# Patient Record
Sex: Male | Born: 1970 | Race: White | Hispanic: No | Marital: Married | Smoking: Former smoker
Health system: Southern US, Community
[De-identification: ages and names within clinical notes are randomized; demographics above are authoritative.]

## PROBLEM LIST (undated history)

## (undated) DIAGNOSIS — M5090 Cervical disc disorder, unspecified, unspecified cervical region: Secondary | ICD-10-CM

## (undated) DIAGNOSIS — N179 Acute kidney failure, unspecified: Secondary | ICD-10-CM

## (undated) DIAGNOSIS — R748 Abnormal levels of other serum enzymes: Secondary | ICD-10-CM

## (undated) DIAGNOSIS — J80 Acute respiratory distress syndrome: Secondary | ICD-10-CM

## (undated) DIAGNOSIS — J690 Pneumonitis due to inhalation of food and vomit: Secondary | ICD-10-CM

## (undated) DIAGNOSIS — F1910 Other psychoactive substance abuse, uncomplicated: Secondary | ICD-10-CM

---

## 2004-10-04 ENCOUNTER — Emergency Department (HOSPITAL_COMMUNITY): Admission: EM | Admit: 2004-10-04 | Discharge: 2004-10-04 | Payer: Self-pay | Admitting: Emergency Medicine

## 2006-02-18 ENCOUNTER — Encounter: Admission: RE | Admit: 2006-02-18 | Discharge: 2006-02-18 | Payer: Self-pay | Admitting: Neurosurgery

## 2006-05-19 ENCOUNTER — Ambulatory Visit (HOSPITAL_COMMUNITY): Admission: RE | Admit: 2006-05-19 | Discharge: 2006-05-19 | Payer: Self-pay | Admitting: Neurology

## 2007-04-20 ENCOUNTER — Encounter: Admission: RE | Admit: 2007-04-20 | Discharge: 2007-04-20 | Payer: Self-pay | Admitting: Neurology

## 2007-07-13 ENCOUNTER — Ambulatory Visit (HOSPITAL_COMMUNITY): Admission: RE | Admit: 2007-07-13 | Discharge: 2007-07-13 | Payer: Self-pay | Admitting: Neurology

## 2009-12-31 ENCOUNTER — Emergency Department (HOSPITAL_COMMUNITY): Admission: EM | Admit: 2009-12-31 | Discharge: 2009-12-31 | Payer: Self-pay | Admitting: Emergency Medicine

## 2010-05-27 ENCOUNTER — Encounter: Payer: Self-pay | Admitting: Neurosurgery

## 2011-09-06 ENCOUNTER — Emergency Department (HOSPITAL_COMMUNITY): Payer: BC Managed Care – PPO

## 2011-09-06 ENCOUNTER — Encounter (HOSPITAL_COMMUNITY): Payer: Self-pay | Admitting: Emergency Medicine

## 2011-09-06 ENCOUNTER — Inpatient Hospital Stay (HOSPITAL_COMMUNITY)
Admission: AD | Admit: 2011-09-06 | Discharge: 2011-09-19 | DRG: 541 | Disposition: A | Discharge status: Home / Self Care | Payer: BC Managed Care – PPO | Attending: Internal Medicine | Admitting: Internal Medicine

## 2011-09-06 ENCOUNTER — Inpatient Hospital Stay (HOSPITAL_COMMUNITY): Payer: BC Managed Care – PPO

## 2011-09-06 ENCOUNTER — Other Ambulatory Visit: Payer: Self-pay

## 2011-09-06 DIAGNOSIS — G8929 Other chronic pain: Secondary | ICD-10-CM | POA: Diagnosis present

## 2011-09-06 DIAGNOSIS — R7309 Other abnormal glucose: Secondary | ICD-10-CM | POA: Diagnosis present

## 2011-09-06 DIAGNOSIS — J9690 Respiratory failure, unspecified, unspecified whether with hypoxia or hypercapnia: Secondary | ICD-10-CM

## 2011-09-06 DIAGNOSIS — J80 Acute respiratory distress syndrome: Secondary | ICD-10-CM | POA: Diagnosis present

## 2011-09-06 DIAGNOSIS — D649 Anemia, unspecified: Secondary | ICD-10-CM | POA: Diagnosis present

## 2011-09-06 DIAGNOSIS — R7989 Other specified abnormal findings of blood chemistry: Secondary | ICD-10-CM | POA: Diagnosis present

## 2011-09-06 DIAGNOSIS — I200 Unstable angina: Secondary | ICD-10-CM | POA: Diagnosis present

## 2011-09-06 DIAGNOSIS — J9600 Acute respiratory failure, unspecified whether with hypoxia or hypercapnia: Principal | ICD-10-CM | POA: Diagnosis present

## 2011-09-06 DIAGNOSIS — I959 Hypotension, unspecified: Secondary | ICD-10-CM | POA: Diagnosis present

## 2011-09-06 DIAGNOSIS — A419 Sepsis, unspecified organism: Secondary | ICD-10-CM | POA: Diagnosis present

## 2011-09-06 DIAGNOSIS — M5020 Other cervical disc displacement, unspecified cervical region: Secondary | ICD-10-CM | POA: Diagnosis present

## 2011-09-06 DIAGNOSIS — M5090 Cervical disc disorder, unspecified, unspecified cervical region: Secondary | ICD-10-CM | POA: Diagnosis present

## 2011-09-06 DIAGNOSIS — N179 Acute kidney failure, unspecified: Secondary | ICD-10-CM | POA: Diagnosis present

## 2011-09-06 DIAGNOSIS — M542 Cervicalgia: Secondary | ICD-10-CM | POA: Diagnosis present

## 2011-09-06 DIAGNOSIS — M25519 Pain in unspecified shoulder: Secondary | ICD-10-CM | POA: Diagnosis present

## 2011-09-06 DIAGNOSIS — J189 Pneumonia, unspecified organism: Secondary | ICD-10-CM

## 2011-09-06 DIAGNOSIS — R748 Abnormal levels of other serum enzymes: Secondary | ICD-10-CM | POA: Diagnosis present

## 2011-09-06 DIAGNOSIS — R1310 Dysphagia, unspecified: Secondary | ICD-10-CM | POA: Diagnosis present

## 2011-09-06 DIAGNOSIS — F1910 Other psychoactive substance abuse, uncomplicated: Secondary | ICD-10-CM | POA: Diagnosis present

## 2011-09-06 DIAGNOSIS — J690 Pneumonitis due to inhalation of food and vomit: Secondary | ICD-10-CM | POA: Diagnosis present

## 2011-09-06 DIAGNOSIS — G9340 Encephalopathy, unspecified: Secondary | ICD-10-CM | POA: Diagnosis present

## 2011-09-06 HISTORY — DX: Acute respiratory distress syndrome: J80

## 2011-09-06 HISTORY — DX: Abnormal levels of other serum enzymes: R74.8

## 2011-09-06 HISTORY — DX: Pneumonitis due to inhalation of food and vomit: J69.0

## 2011-09-06 HISTORY — DX: Other psychoactive substance abuse, uncomplicated: F19.10

## 2011-09-06 HISTORY — DX: Acute kidney failure, unspecified: N17.9

## 2011-09-06 HISTORY — DX: Cervical disc disorder, unspecified, unspecified cervical region: M50.90

## 2011-09-06 LAB — COMPREHENSIVE METABOLIC PANEL
Alkaline Phosphatase: 177 U/L — ABNORMAL HIGH (ref 39–117)
BUN: 5 mg/dL — ABNORMAL LOW (ref 6–23)
BUN: 6 mg/dL (ref 6–23)
CO2: 29 mEq/L (ref 19–32)
CO2: 25 mEq/L (ref 19–32)
Calcium: 7.9 mg/dL — ABNORMAL LOW (ref 8.4–10.5)
Chloride: 99 mEq/L (ref 96–112)
Creatinine, Ser: 0.65 mg/dL (ref 0.50–1.35)
GFR calc Af Amer: >90 mL/min (ref >90)
GFR calc Af Amer: >90 mL/min (ref >90)
GFR calc non Af Amer: >90 mL/min (ref >90)
GFR calc non Af Amer: >90 mL/min (ref >90)
Glucose, Bld: 137 mg/dL — ABNORMAL HIGH (ref 70–99)
Glucose, Bld: 116 mg/dL — ABNORMAL HIGH (ref 70–99)
Potassium: 3 mEq/L — ABNORMAL LOW (ref 3.5–5.1)
Total Bilirubin: 0.2 mg/dL — ABNORMAL LOW (ref 0.3–1.2)
Total Bilirubin: 0.2 mg/dL — ABNORMAL LOW (ref 0.3–1.2)
Total Protein: 6.8 g/dL (ref 6.0–8.3)

## 2011-09-06 LAB — LACTIC ACID, PLASMA: Lactic Acid, Venous: 7 mmol/L — ABNORMAL HIGH (ref 0.5–2.2)

## 2011-09-06 LAB — URINALYSIS, ROUTINE W REFLEX MICROSCOPIC
Bilirubin Urine: NEGATIVE
Glucose, UA: NEGATIVE mg/dL
Ketones, ur: NEGATIVE mg/dL
Ketones, ur: NEGATIVE mg/dL
Nitrite: NEGATIVE
Nitrite: NEGATIVE
Protein, ur: NEGATIVE mg/dL
Protein, ur: NEGATIVE mg/dL
Urobilinogen, UA: 0.2 mg/dL (ref 0.0–1.0)
Urobilinogen, UA: 0.2 mg/dL (ref 0.0–1.0)

## 2011-09-06 LAB — CBC
HCT: 37.4 % — ABNORMAL LOW (ref 39.0–52.0)
Hemoglobin: 14 g/dL (ref 13.0–17.0)
MCH: 28.9 pg (ref 26.0–34.0)
MCV: 88 fL (ref 78.0–100.0)
Platelets: 328 10*3/uL (ref 150–400)
RBC: 4.85 MIL/uL (ref 4.22–5.81)
RBC: 4.25 MIL/uL (ref 4.22–5.81)
WBC: 7.4 10*3/uL (ref 4.0–10.5)

## 2011-09-06 LAB — POCT I-STAT 3, ART BLOOD GAS (G3+)
Acid-base deficit: 2 mmol/L (ref 0.0–2.0)
Bicarbonate: 25.3 mEq/L — ABNORMAL HIGH (ref 20.0–24.0)
Bicarbonate: 26.6 mEq/L — ABNORMAL HIGH (ref 20.0–24.0)
Bicarbonate: 24 mEq/L (ref 20.0–24.0)
O2 Saturation: 97 %
Patient temperature: 98.6
Patient temperature: 98.6
Patient temperature: 98.6
TCO2: 27 mmol/L (ref 0–100)
TCO2: 26 mmol/L (ref 0–100)
pCO2 arterial: 67.3 mmHg (ref 35.0–45.0)
pCO2 arterial: 68.1 mmHg (ref 35.0–45.0)
pCO2 arterial: 58.6 mmHg (ref 35.0–45.0)
pH, Arterial: 7.183 — CL (ref 7.350–7.450)
pH, Arterial: 7.199 — CL (ref 7.350–7.450)
pH, Arterial: 7.256 — ABNORMAL LOW (ref 7.350–7.450)
pH, Arterial: 7.235 — ABNORMAL LOW (ref 7.350–7.450)
pO2, Arterial: 50 mmHg — ABNORMAL LOW (ref 80.0–100.0)
pO2, Arterial: 125 mmHg — ABNORMAL HIGH (ref 80.0–100.0)

## 2011-09-06 LAB — RAPID URINE DRUG SCREEN, HOSP PERFORMED
Benzodiazepines: POSITIVE — AB
Cocaine: NOT DETECTED

## 2011-09-06 LAB — GLUCOSE, CAPILLARY
Glucose-Capillary: 145 mg/dL — ABNORMAL HIGH (ref 70–99)
Glucose-Capillary: 92 mg/dL (ref 70–99)

## 2011-09-06 LAB — BLOOD GAS, ARTERIAL
Bicarbonate: 26.9 mEq/L — ABNORMAL HIGH (ref 20.0–24.0)
Bicarbonate: 24.4 mEq/L — ABNORMAL HIGH (ref 20.0–24.0)
O2 Saturation: 80.4 %
PEEP: 10 cmH2O
PEEP: 20 cmH2O
TCO2: 23.3 mmol/L (ref 0–100)
pCO2 arterial: 66.8 mmHg (ref 35.0–45.0)
pCO2 arterial: 63.2 mmHg (ref 35.0–45.0)
pH, Arterial: 7.211 — ABNORMAL LOW (ref 7.350–7.450)
pO2, Arterial: 50.8 mmHg — ABNORMAL LOW (ref 80.0–100.0)
pO2, Arterial: 79.9 mmHg — ABNORMAL LOW (ref 80.0–100.0)

## 2011-09-06 LAB — ETHANOL: Alcohol, Ethyl (B): <11 mg/dL (ref 0–11)

## 2011-09-06 LAB — DIFFERENTIAL
Eosinophils Absolute: 0 10*3/uL (ref 0.0–0.7)
Lymphocytes Relative: 13 % (ref 12–46)
Lymphs Abs: 2.4 10*3/uL (ref 0.7–4.0)
Monocytes Relative: 9 % (ref 3–12)
Neutrophils Relative %: 78 % — ABNORMAL HIGH (ref 43–77)

## 2011-09-06 LAB — CK: Total CK: 235 U/L — ABNORMAL HIGH (ref 7–232)

## 2011-09-06 LAB — PROCALCITONIN: Procalcitonin: 12.79 ng/mL

## 2011-09-06 LAB — PHOSPHORUS: Phosphorus: 2.6 mg/dL (ref 2.3–4.6)

## 2011-09-06 LAB — LIPASE, BLOOD: Lipase: 19 U/L (ref 11–59)

## 2011-09-06 LAB — PROTIME-INR: INR: 1.1 (ref 0.00–1.49)

## 2011-09-06 LAB — URINE MICROSCOPIC-ADD ON

## 2011-09-06 LAB — POCT I-STAT TROPONIN I: Troponin i, poc: 0 ng/mL (ref 0.00–0.08)

## 2011-09-06 LAB — C-REACTIVE PROTEIN: CRP: 14.52 mg/dL — ABNORMAL HIGH (ref <0.60)

## 2011-09-06 LAB — APTT: aPTT: 28 seconds (ref 24–37)

## 2011-09-06 LAB — MRSA PCR SCREENING: MRSA by PCR: NEGATIVE

## 2011-09-06 LAB — CARBOXYHEMOGLOBIN: O2 Saturation: 81.1 %

## 2011-09-06 MED ORDER — STUDY - INVESTIGATIONAL DRUG SIMPLE RECORD
40.0000 mg | Freq: Once | Status: AC
  Administered 2011-09-06: 40 mg via ORAL
  Filled 2011-09-06: qty 40

## 2011-09-06 MED ORDER — SODIUM CHLORIDE 0.9 % IV BOLUS (SEPSIS)
1000.0000 mL | Freq: Once | INTRAVENOUS | Status: AC
  Administered 2011-09-06: 1000 mL via INTRAVENOUS

## 2011-09-06 MED ORDER — SODIUM CHLORIDE 0.9 % IV SOLN
2.0000 mg/h | INTRAVENOUS | Status: DC
  Administered 2011-09-06 – 2011-09-07 (×2): 2 mg/h via INTRAVENOUS
  Administered 2011-09-08: 3 mg/h via INTRAVENOUS
  Filled 2011-09-06 (×5): qty 10

## 2011-09-06 MED ORDER — MIDAZOLAM BOLUS VIA INFUSION
1.0000 mg | INTRAVENOUS | Status: DC | PRN
  Filled 2011-09-06 (×2): qty 2

## 2011-09-06 MED ORDER — DOBUTAMINE IN D5W 4-5 MG/ML-% IV SOLN
2.5000 ug/kg/min | INTRAVENOUS | Status: DC | PRN
  Filled 2011-09-06: qty 250

## 2011-09-06 MED ORDER — MIDAZOLAM HCL 2 MG/2ML IJ SOLN
4.0000 mg | Freq: Once | INTRAMUSCULAR | Status: AC
  Administered 2011-09-06: 4 mg via INTRAVENOUS

## 2011-09-06 MED ORDER — SODIUM CHLORIDE 0.9 % IV SOLN
INTRAVENOUS | Status: DC
  Filled 2011-09-06: qty 1

## 2011-09-06 MED ORDER — VANCOMYCIN HCL IN DEXTROSE 1-5 GM/200ML-% IV SOLN
1000.0000 mg | Freq: Two times a day (BID) | INTRAVENOUS | Status: DC
  Administered 2011-09-07: 1000 mg via INTRAVENOUS
  Filled 2011-09-06 (×2): qty 200

## 2011-09-06 MED ORDER — FENTANYL BOLUS VIA INFUSION
50.0000 ug | Freq: Four times a day (QID) | INTRAVENOUS | Status: DC | PRN
  Filled 2011-09-06: qty 100

## 2011-09-06 MED ORDER — SUCCINYLCHOLINE CHLORIDE 20 MG/ML IJ SOLN
200.0000 mg | Freq: Once | INTRAMUSCULAR | Status: AC
  Administered 2011-09-06: 200 mg via INTRAVENOUS

## 2011-09-06 MED ORDER — PANTOPRAZOLE SODIUM 40 MG IV SOLR
40.0000 mg | Freq: Every day | INTRAVENOUS | Status: DC
  Administered 2011-09-06 – 2011-09-08 (×3): 40 mg via INTRAVENOUS
  Filled 2011-09-06 (×4): qty 40

## 2011-09-06 MED ORDER — SODIUM CHLORIDE 0.9 % IV SOLN
INTRAVENOUS | Status: DC | PRN
  Administered 2011-09-06 – 2011-09-08 (×2): 10 mL/h via INTRAVENOUS
  Administered 2011-09-09: 20 mL/h via INTRAVENOUS
  Administered 2011-09-11 – 2011-09-12 (×2): 250 mL via INTRAVENOUS
  Administered 2011-09-14: 21:00:00 via INTRAVENOUS

## 2011-09-06 MED ORDER — FENTANYL CITRATE 0.05 MG/ML IJ SOLN: 100.0000 ug | Freq: Once | INTRAMUSCULAR | Status: AC

## 2011-09-06 MED ORDER — FENTANYL CITRATE 0.05 MG/ML IJ SOLN
100.0000 ug | Freq: Once | INTRAMUSCULAR | Status: AC
  Administered 2011-09-06: 100 ug via INTRAVENOUS

## 2011-09-06 MED ORDER — ETOMIDATE 2 MG/ML IV SOLN
20.0000 mg | Freq: Once | INTRAVENOUS | Status: AC
  Administered 2011-09-06: 20 mg via INTRAVENOUS

## 2011-09-06 MED ORDER — SODIUM CHLORIDE 0.9 % IV SOLN
50.0000 ug/h | INTRAVENOUS | Status: DC
  Administered 2011-09-06: 100 ug/h via INTRAVENOUS
  Administered 2011-09-07 – 2011-09-08 (×2): 200 ug/h via INTRAVENOUS
  Filled 2011-09-06 (×5): qty 50

## 2011-09-06 MED ORDER — SODIUM CHLORIDE 0.9 % IV SOLN
3.0000 g | Freq: Once | INTRAVENOUS | Status: AC
  Administered 2011-09-06: 3 g via INTRAVENOUS
  Filled 2011-09-06: qty 3

## 2011-09-06 MED ORDER — VANCOMYCIN HCL 1000 MG IV SOLR
2000.0000 mg | Freq: Once | INTRAVENOUS | Status: AC
  Administered 2011-09-06: 2000 mg via INTRAVENOUS
  Filled 2011-09-06: qty 2000

## 2011-09-06 MED ORDER — MIDAZOLAM HCL 5 MG/5ML IJ SOLN
INTRAMUSCULAR | Status: AC
  Administered 2011-09-06: 4 mg via INTRAVENOUS
  Filled 2011-09-06: qty 5

## 2011-09-06 MED ORDER — MIDAZOLAM HCL 5 MG/5ML IJ SOLN
4.0000 mg | Freq: Once | INTRAMUSCULAR | Status: AC
  Administered 2011-09-06: 4 mg via INTRAVENOUS

## 2011-09-06 MED ORDER — FENTANYL CITRATE 0.05 MG/ML IJ SOLN
INTRAMUSCULAR | Status: AC
  Administered 2011-09-06: 100 ug via INTRAVENOUS
  Filled 2011-09-06: qty 2

## 2011-09-06 MED ORDER — HEPARIN SODIUM (PORCINE) 5000 UNIT/ML IJ SOLN
5000.0000 [IU] | Freq: Three times a day (TID) | INTRAMUSCULAR | Status: DC
  Administered 2011-09-06 – 2011-09-07 (×3): 5000 [IU] via SUBCUTANEOUS
  Filled 2011-09-06 (×6): qty 1

## 2011-09-06 MED ORDER — PIPERACILLIN-TAZOBACTAM 3.375 G IVPB
3.3750 g | Freq: Three times a day (TID) | INTRAVENOUS | Status: DC
  Administered 2011-09-06 – 2011-09-09 (×9): 3.375 g via INTRAVENOUS
  Filled 2011-09-06 (×11): qty 50

## 2011-09-06 MED ORDER — SODIUM CHLORIDE 0.9 % IV BOLUS (SEPSIS): 500.0000 mL | INTRAVENOUS | Status: DC | PRN

## 2011-09-06 MED ORDER — LORAZEPAM 2 MG/ML IJ SOLN
2.0000 mg | Freq: Once | INTRAMUSCULAR | Status: AC
  Administered 2011-09-06: 2 mg via INTRAVENOUS

## 2011-09-06 MED ORDER — BIOTENE DRY MOUTH MT LIQD
15.0000 mL | Freq: Four times a day (QID) | OROMUCOSAL | Status: DC
  Administered 2011-09-06 – 2011-09-14 (×30): 15 mL via OROMUCOSAL

## 2011-09-06 MED ORDER — STUDY - INVESTIGATIONAL DRUG SIMPLE RECORD
20.0000 mg | Freq: Every day | Status: DC
  Administered 2011-09-07 – 2011-09-08 (×2): 20 mg via ORAL
  Administered 2011-09-09: 10 mg via ORAL
  Filled 2011-09-06 (×3): qty 20

## 2011-09-06 MED ORDER — FENTANYL CITRATE 0.05 MG/ML IJ SOLN
INTRAMUSCULAR | Status: AC
  Filled 2011-09-06: qty 2

## 2011-09-06 MED ORDER — SODIUM CHLORIDE 0.9 % IV BOLUS (SEPSIS): 500.0000 mL | Freq: Once | INTRAVENOUS | Status: DC

## 2011-09-06 MED ORDER — MIDAZOLAM HCL 5 MG/5ML IJ SOLN
INTRAMUSCULAR | Status: AC
  Filled 2011-09-06: qty 5

## 2011-09-06 MED ORDER — VASOPRESSIN 20 UNIT/ML IJ SOLN
0.0300 [IU]/min | INTRAVENOUS | Status: DC
  Filled 2011-09-06 (×2): qty 2.5

## 2011-09-06 MED ORDER — CHLORHEXIDINE GLUCONATE 0.12 % MT SOLN
15.0000 mL | Freq: Two times a day (BID) | OROMUCOSAL | Status: DC
  Administered 2011-09-06 – 2011-09-13 (×15): 15 mL via OROMUCOSAL
  Filled 2011-09-06 (×15): qty 15

## 2011-09-06 MED ORDER — NOREPINEPHRINE BITARTRATE 1 MG/ML IJ SOLN
2.0000 ug/min | INTRAVENOUS | Status: DC
  Administered 2011-09-06: 30 ug/min via INTRAVENOUS
  Filled 2011-09-06 (×2): qty 16

## 2011-09-06 MED ORDER — MIDAZOLAM HCL 5 MG/5ML IJ SOLN: 4.0000 mg | Freq: Once | INTRAMUSCULAR | Status: AC

## 2011-09-06 MED ORDER — NOREPINEPHRINE BITARTRATE 1 MG/ML IJ SOLN
2.0000 ug/min | INTRAVENOUS | Status: DC | PRN
  Administered 2011-09-06: 14 ug/min via INTRAVENOUS
  Filled 2011-09-06 (×2): qty 4

## 2011-09-06 MED ORDER — PHENYLEPHRINE HCL 10 MG/ML IJ SOLN
30.0000 ug/min | INTRAVENOUS | Status: DC
  Administered 2011-09-06: 20 ug/min via INTRAVENOUS
  Filled 2011-09-06 (×2): qty 1

## 2011-09-06 NOTE — ED Notes (Signed)
CRITICAL VALUE ALERT  Critical value received:  PH- 7.211, PCO2- 63.2, P02- 79.9, HCO3- 24.4 Sa02- 93%  Date of notification:  09/06/11  Time of notification:  1105  Critical value read back:yes  Nurse who received alert:  Primitivo Gauze  MD notified (1st page):  1105  Time of first page:  1105  MD notified (2nd page):  Time of second page:  Responding MD:  Dr. Bebe Shaggy  Time MD responded:  [phone]

## 2011-09-06 NOTE — Significant Event (Signed)
Febrile, progressive hypotension, and aspiration pneumonia.  Likely has sepsis.  Will start sepsis protocol.  Coralyn Helling, MD 09/06/2011, 5:15 PM [phone]

## 2011-09-06 NOTE — Research (Signed)
SAILS clinical research trial -Statins for Acutely Injured Lungs from Sepsis discussed with patient's wife.  All questions and concerns were addressed and answered prior to obtaining informed consent.  No study procedures were initiated prior to obtaining informed consent.  A copy of the signed consent was provided to the wife.  Patient is currently pressor dependent.  Please contact CCM physician or study coordinator with any questions or concerns.
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
## 2011-09-06 NOTE — Significant Event (Addendum)
Refractory respiratory acidosis.  Already on rate of 35.  Will increase Vt from 7 to 8 cc/kg.  Coralyn Helling, MD 09/06/2011, 3:34 PM [phone]  Pt given 2 mg ativan, but unable to remain still for MRI.  Nurse notes in prior records that pt required conscious sedation for prior MRI.  Concern is that she could develop respiratory arrest if given enough sedation to do MRI w/o secure airway.  Advised nurse to abort procedure, and d/w neurosurgery about importance of this test at this time.  If MRI needed urgently, then would likely need to have done with anesthesia assistance.  Coralyn Helling, MD 09/06/2011, 4:21 PM [phone]

## 2011-09-06 NOTE — Procedures (Signed)
Right femoral Arterial Catheter Insertion Procedure Note Alan Koch [id number] 1970-10-28  Procedure: Insertion of Arterial Catheter  Indications: Blood pressure monitoring and Frequent blood sampling  Procedure Details Consent: Risks of procedure as well as the alternatives and risks of each were explained to the (patient/caregiver).  Consent for procedure obtained. Time Out: Verified patient identification, verified procedure, site/side was marked, verified correct patient position, special equipment/implants available, medications/allergies/relevent history reviewed, required imaging and test results available.  Performed  Maximum sterile technique was used including antiseptics, cap, gloves, gown, hand hygiene, mask and sheet. Using real-time [place] to identify and cannulate the right femoral artery.  Skin prep: Chlorhexidine; local anesthetic administered 20 gauge catheter was inserted into right femoral artery using the Seldinger technique.  Evaluation Had 2 prior attempts at right radial artery. Got good blood flow but could not thread GW.  Blood flow good; BP tracing good. Complications: No apparent complications.   Koch,Alan 09/06/2011   Billy Fischer, MD;  PCCM service; Mobile [phone]
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
## 2011-09-06 NOTE — ED Notes (Signed)
Rn at bedside with pt. Pt to CT with RNx 2 and RT.

## 2011-09-06 NOTE — ED Notes (Signed)
RN at bedside throughout pt stay.

## 2011-09-06 NOTE — Progress Notes (Signed)
ARDS protocol started at 1030, starting on 8 ml (pt is 6').  Initial ABG PO2 was 50 on 100% and 10 peep.  Vent settings now are PRVC RR 20, VT 620, 100%, peep 20.  Performed recruitment maneuver on patient to help oxygenation with a very little improvement.  sats now are 88%.  Will be getting ABG in 20 minutes.
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
## 2011-09-07 ENCOUNTER — Encounter (HOSPITAL_COMMUNITY): Payer: Self-pay | Admitting: Nurse Practitioner

## 2011-09-07 ENCOUNTER — Inpatient Hospital Stay (HOSPITAL_COMMUNITY): Payer: BC Managed Care – PPO

## 2011-09-07 DIAGNOSIS — J690 Pneumonitis due to inhalation of food and vomit: Secondary | ICD-10-CM | POA: Diagnosis present

## 2011-09-07 DIAGNOSIS — I959 Hypotension, unspecified: Secondary | ICD-10-CM | POA: Diagnosis present

## 2011-09-07 DIAGNOSIS — N179 Acute kidney failure, unspecified: Secondary | ICD-10-CM | POA: Diagnosis present

## 2011-09-07 DIAGNOSIS — J189 Pneumonia, unspecified organism: Secondary | ICD-10-CM

## 2011-09-07 DIAGNOSIS — J984 Other disorders of lung: Secondary | ICD-10-CM

## 2011-09-07 DIAGNOSIS — R7989 Other specified abnormal findings of blood chemistry: Secondary | ICD-10-CM

## 2011-09-07 DIAGNOSIS — R748 Abnormal levels of other serum enzymes: Secondary | ICD-10-CM | POA: Diagnosis present

## 2011-09-07 LAB — BASIC METABOLIC PANEL
GFR calc Af Amer: 45 mL/min — ABNORMAL LOW (ref >90)
GFR calc non Af Amer: 39 mL/min — ABNORMAL LOW (ref >90)
Potassium: 4.7 mEq/L (ref 3.5–5.1)
Sodium: 137 mEq/L (ref 135–145)

## 2011-09-07 LAB — ALT: ALT: 28 U/L (ref 0–53)

## 2011-09-07 LAB — POCT I-STAT 3, ART BLOOD GAS (G3+)
Bicarbonate: 24.2 mEq/L — ABNORMAL HIGH (ref 20.0–24.0)
Patient temperature: 101
TCO2: 25 mmol/L (ref 0–100)
pCO2 arterial: 42.7 mmHg (ref 35.0–45.0)
pH, Arterial: 7.368 (ref 7.350–7.450)

## 2011-09-07 LAB — CARDIAC PANEL(CRET KIN+CKTOT+MB+TROPI)
Relative Index: 1.5 (ref 0.0–2.5)
Total CK: 493 U/L — ABNORMAL HIGH (ref 7–232)
Troponin I: 1.15 ng/mL (ref <0.30)

## 2011-09-07 LAB — GLUCOSE, CAPILLARY
Glucose-Capillary: 98 mg/dL (ref 70–99)
Glucose-Capillary: 92 mg/dL (ref 70–99)
Glucose-Capillary: 72 mg/dL (ref 70–99)

## 2011-09-07 LAB — AST: AST: 41 U/L — ABNORMAL HIGH (ref 0–37)

## 2011-09-07 LAB — URINE CULTURE
Culture: NO GROWTH
Culture: NO GROWTH

## 2011-09-07 LAB — CBC
MCHC: 32.7 g/dL (ref 30.0–36.0)
RDW: 15.7 % — ABNORMAL HIGH (ref 11.5–15.5)
WBC: 20.4 10*3/uL — ABNORMAL HIGH (ref 4.0–10.5)

## 2011-09-07 LAB — CORTISOL: Cortisol, Plasma: 15.8 ug/dL

## 2011-09-07 LAB — MAGNESIUM: Magnesium: 1.2 mg/dL — ABNORMAL LOW (ref 1.5–2.5)

## 2011-09-07 MED ORDER — HEPARIN (PORCINE) IN NACL 100-0.45 UNIT/ML-% IJ SOLN
1200.0000 [IU]/h | INTRAMUSCULAR | Status: DC
  Administered 2011-09-07: 1200 [IU]/h via INTRAVENOUS
  Filled 2011-09-07 (×2): qty 250

## 2011-09-07 MED ORDER — ASPIRIN 81 MG PO CHEW
CHEWABLE_TABLET | ORAL | Status: AC
  Filled 2011-09-07: qty 1

## 2011-09-07 MED ORDER — ASPIRIN 81 MG PO CHEW
81.0000 mg | CHEWABLE_TABLET | Freq: Every day | ORAL | Status: DC
  Administered 2011-09-08 – 2011-09-11 (×4): 81 mg
  Filled 2011-09-07 (×4): qty 1

## 2011-09-07 MED ORDER — ASPIRIN EC 81 MG PO TBEC
81.0000 mg | DELAYED_RELEASE_TABLET | Freq: Every day | ORAL | Status: DC
  Filled 2011-09-07: qty 1

## 2011-09-07 MED ORDER — HEPARIN BOLUS VIA INFUSION
4000.0000 [IU] | Freq: Once | INTRAVENOUS | Status: AC
  Administered 2011-09-07: 4000 [IU] via INTRAVENOUS
  Filled 2011-09-07: qty 4000

## 2011-09-07 MED ORDER — INSULIN ASPART 100 UNIT/ML ~~LOC~~ SOLN: 0.0000 [IU] | SUBCUTANEOUS | Status: DC

## 2011-09-07 MED ORDER — HEPARIN (PORCINE) IN NACL 100-0.45 UNIT/ML-% IJ SOLN
2000.0000 [IU]/h | INTRAMUSCULAR | Status: DC
  Administered 2011-09-07: 1400 [IU]/h via INTRAVENOUS
  Administered 2011-09-08 (×2): 1700 [IU]/h via INTRAVENOUS
  Filled 2011-09-07 (×3): qty 250

## 2011-09-07 MED ORDER — METOPROLOL TARTRATE 1 MG/ML IV SOLN: 2.5000 mg | Freq: Four times a day (QID) | INTRAVENOUS | Status: DC

## 2011-09-07 MED ORDER — HEPARIN BOLUS VIA INFUSION
1400.0000 [IU] | Freq: Once | INTRAVENOUS | Status: AC
  Administered 2011-09-07: 1400 [IU] via INTRAVENOUS
  Filled 2011-09-07: qty 1400

## 2011-09-07 MED ORDER — VANCOMYCIN HCL 1000 MG IV SOLR
750.0000 mg | Freq: Two times a day (BID) | INTRAVENOUS | Status: DC
  Administered 2011-09-07 – 2011-09-08 (×2): 750 mg via INTRAVENOUS
  Filled 2011-09-07 (×3): qty 750

## 2011-09-07 MED ORDER — SODIUM CHLORIDE 0.9 % IV BOLUS (SEPSIS)
1000.0000 mL | Freq: Once | INTRAVENOUS | Status: AC
  Administered 2011-09-07: 1000 mL via INTRAVENOUS

## 2011-09-07 MED ORDER — HEPARIN BOLUS VIA INFUSION
2000.0000 [IU] | Freq: Once | INTRAVENOUS | Status: AC
  Administered 2011-09-08: 2000 [IU] via INTRAVENOUS
  Filled 2011-09-07: qty 2000

## 2011-09-07 NOTE — Progress Notes (Signed)
ANTICOAGULATION CONSULT NOTE - Follow Up Consult  Pharmacy Consult for heparin Indication: chest pain/ACS  Allergies  Allergen Reactions  . Aleve (Naproxen Sodium) Hives   Patient Measurements: Height: 6' (182.9 cm) Weight: 212 lb 15.4 oz (96.6 kg) IBW/kg (Calculated) : 77.6  Heparin Dosing Weight: 96.6kg  Vital Signs: Temp: 99.9 F (37.7 C) (05/04 1400) Temp src: Core (Comment) (05/04 1400) BP: 100/52 mmHg (05/04 1400) Pulse Rate: 99  (05/04 1130)  Labs:  Basename 09/07/11 1430 09/07/11 0443 09/07/11 0442 09/06/11 2353 09/06/11 1430 09/06/11 0905  HGB -- 11.1* -- -- 12.1* --  HCT -- 33.9* -- -- 37.4* 44.1  PLT -- 314 -- -- 328 464*  APTT -- -- -- -- 28 --  LABPROT -- -- -- -- 14.4 --  INR -- -- -- -- 1.10 --  HEPARINUNFRC 0.19* -- -- -- -- --  CREATININE -- 2.04* -- -- 0.65 0.76  CKTOTAL -- 883* 885* 493* -- --  CKMB -- -- 12.9* 8.8* -- --  TROPONINI -- -- 1.15* 0.90* -- --   Estimated Creatinine Clearance: 58 ml/min (by C-G formula based on Cr of 2.04).   Medications:  Infusions:    . fentaNYL infusion INTRAVENOUS 200 mcg/hr (09/07/11 1300)  . heparin 1,200 Units/hr (09/07/11 0740)  . midazolam (VERSED) infusion 3 mg/hr (09/07/11 1000)  . norepinephrine (LEVOPHED) Adult infusion Stopped (09/07/11 1300)  . phenylephrine (NEO-SYNEPHRINE) Adult infusion 10 mcg/min (09/07/11 0130)  . DISCONTD: insulin (NOVOLIN-R) infusion    . DISCONTD: vasopressin (PITRESSIN) infusion - *FOR SHOCK*      Assessment: 40 yom started on IV heparin due to increasing cardiac markers. Suspected NSTEMI vs. Demand ischemia. First heparin level is subtherapeutic at 0.19. No bleeding noted. Pt is slightly anemic but plts are WNL.   Goal of Therapy:  Heparin level 0.3-0.7 units/ml   Plan:  1. Heparin bolus 1400 units/hr 2. Increase heparin gtt rate to 1400 units/hr 3. Check a 6 hour heparin level 4. F/u cardiology plan and duration of heparin therapy  Deo Mehringer, Drake Leach 09/07/2011,3:13 PM

## 2011-09-07 NOTE — H&P (Deleted)
Name: Alan Koch MRN: [id number] DOB: 07-29-1970    LOS: 1 PCCM PROGRESS  NOTE  History of Present Illness: 41 yo found down and admitted for aspiration pneumonia, ARDS and sepsis.  Lines / Drains: 5/3  OETT >> 5/3  OGT>> 5/3  R IJ CVL >> 5/3  R rad A-line>>  Cultures: 5/3  Sputum >>  5/3  Blood >> 5/3  Urine>>  Antibiotics: 5/3  vanc>> 5/3  zoysn>>  Tests / Events: 5/3  Found down, intubated, admitted for suspected aspiration / ARDS /sepsis  Subjective: Minimal pressors requirements overnight.  Intermittently agitated.  Vital Signs: Temp:  [100.6 F (38.1 C)-103.8 F (39.9 C)] 100.6 F (38.1 C) (05/04 0930) Pulse Rate:  [83-129] 99  (05/04 0930) Resp:  [21-36] 30  (05/04 0930) BP: (59-141)/(50-107) 112/62 mmHg (05/04 0900) SpO2:  [74 %-100 %] 98 % (05/04 0930) Arterial Line BP: (79-154)/(37-79) 106/53 mmHg (05/04 0900) FiO2 (%):  [59.9 %-100 %] 60.5 % (05/04 0930) Weight:  [96.6 kg (212 lb 15.4 oz)] 96.6 kg (212 lb 15.4 oz) (05/04 0500) I/O last 3 completed shifts: In: 1810 [P.O.:20; I.V.:910; NG/GT:30; IV Piggyback:850] Out: 805 [Urine:805]  Physical Examination: General:  No acute distress Neuro:  Sedated, intermittently agitated, nonfocal  HEENT:  No JVD Cardiovascular:  Tachycardic, regular, no murmurs Lungs:  Bilateral air entry, few rhonchi Abdomen:  Bowel sounds present Musculoskeletal:  Moves all extremities Skin:  No rash  Ventilator settings: Vent Mode:  [-] PRVC FiO2 (%):  [59.9 %-100 %] 60.5 % Set Rate:  [20 bmp-35 bmp] 30 bmp Vt Set:  [540 mL-620 mL] 620 mL PEEP:  [10 cmH20-20 cmH20] 10 cmH20 Plateau Pressure:  [27 cmH20-41 cmH20] 28 cmH20  Labs and Imaging: Reviewed.  ASSESSMENT AND PLAN  NEUROLOGIC A:  Acute encephalopathy secondary to sedation.  Head CT >>> NAD. P: -->  Versed / Fentanyl gtt to goal RASS 0 to -1 -->  Daily WUA  PULMONARY  Lab 09/07/11 0443 09/06/11 1833 09/06/11 1800 09/06/11 1620 09/06/11 1513 09/06/11  1336  PHART 7.368 7.235* -- 7.256* 7.199* 7.183*  PCO2ART 42.7 58.6* -- 58.7* 68.1* 67.3*  PO2ART 79.0* 125.0* -- 50.0* 89.0 48.0*  HCO3 24.2* 24.0 -- 26.1* 26.6* 25.3*  O2SAT 94.0 97.0 81.1 78.0 94.0 --   5/3  CXR >>> tubes/lines good position, bilateral airspace disease   A:  Suspected aspiration pneumonia / pneumonitis / ARDS. P: -->  Full mechanical support - ARDS protocol -->  Goal pH>7.30, goal SpO2>92 -->  Follow up ABG -->  Follow up CXR -->  Bronchodilators -->  Daily SBT  CARDIOVASCULAR  Lab 09/07/11 0442 09/06/11 2353 09/06/11 1430 09/06/11 0905  TROPONINI 1.15* 0.90* -- --  LATICACIDVEN -- -- 3.1* 7.0*  PROBNP -- -- -- --   A:  Acute coronary syndrome (NSTEMI vs demand ischemia). P: -->  NS boluses to goal CVP>10 -->  Levophed to goal MAP>60 -->  Heparin gtt x 48 h -->  Unable to use BB and ACEI as hypotensive -->  Unable to use ASA as allergy to NSAID -->  Trend sepsis markers  RENAL  Lab 09/07/11 0443 09/06/11 1430 09/06/11 0905  NA 137 141 143  K 4.7 3.6 --  CL 103 107 99  CO2 22 25 29   BUN 22 6 5*  CREATININE 2.04* 0.65 0.76  CALCIUM 7.8* 7.9* 9.4  MG -- 1.2* --  PHOS -- 2.6 --   A:  Acute renal failure. P: -->  BMP, Mg, Phos  in AM -->  Mg level today.  GASTROINTESTINAL  Lab 09/07/11 0443 09/06/11 1430 09/06/11 0905  AST 41* 38* 40*  ALT 28 31 43  ALKPHOS -- 117 177*  BILITOT -- 0.2* 0.2*  PROT -- 5.4* 6.8  ALBUMIN -- 2.6* 3.4*   A:  No active issues. P: -->  GI Px  HEMATOLOGIC  Lab 09/07/11 0443 09/06/11 1430 09/06/11 0905  HGB 11.1* 12.1* 14.0  HCT 33.9* 37.4* 44.1  PLT 314 328 464*  INR -- 1.10 --  APTT -- 28 --   A:  Mild stable anemia.  No overt hemorrhage. P: -->  CBC in AM  INFECTIOUS  Lab 09/07/11 0443 09/06/11 1430 09/06/11 0912 09/06/11 0905  WBC 20.4* 7.4 -- 19.3*  PROCALCITON -- 32.19 12.79 --   A:  Suspected aspiration pneumonia. P: -->  Vancomycin / Zosyn as above -->  Follow  cultures  ENDOCRINE  Lab 09/07/11 0005 09/06/11 2039 09/06/11 1551 09/06/11 1219 09/06/11 0858  GLUCAP 95 98 92 109* 145*   A:  Hyperglycemia.  No history of DM.  Unknown adrenal / thyroid function. P: -->  CBG checks / SSI  BEST PRACTICE / DISPOSITION -->  ICU status under PCCM -->  Full code -->  NPO -->  DVT Px is not indicated (on Heparin gtt) -->  Protonix IV for GI Px -->  Ventilator bundle -->  Family updated at bedside  The patient is critically ill with multiple organ systems failure and requires high complexity decision making for assessment and support, frequent evaluation and titration of therapies, application of advanced monitoring technologies and extensive interpretation of multiple databases. Critical Care Time devoted to patient care services described in this note is 45 minutes.  Orlean Bradford, M.D., F.C.C.P. Pulmonary and Critical Care Medicine [facility] Cell: [phone] Pager: [phone]  09/07/2011, 10:20 AM

## 2011-09-07 NOTE — Progress Notes (Signed)
ANTICOAGULATION CONSULT NOTE - Initial Consult  Pharmacy Consult for Heparin Indication: chest pain/ACS  Allergies  Allergen Reactions  . Aleve (Naproxen Sodium) Hives    Patient Measurements: Height: 6' (182.9 cm) Weight: 212 lb 15.4 oz (96.6 kg) IBW/kg (Calculated) : 77.6   Vital Signs: Temp: 101.1 F (38.4 C) (05/04 0600) BP: 84/64 mmHg (05/04 0600) Pulse Rate: 108  (05/04 0600)  Labs:  Basename 09/07/11 0443 09/07/11 0442 09/06/11 2353 09/06/11 1430 09/06/11 0905  HGB 11.1* -- -- 12.1* --  HCT 33.9* -- -- 37.4* 44.1  PLT 314 -- -- 328 464*  APTT -- -- -- 28 --  LABPROT -- -- -- 14.4 --  INR -- -- -- 1.10 --  HEPARINUNFRC -- -- -- -- --  CREATININE 2.04* -- -- 0.65 0.76  CKTOTAL 883* 885* 493* -- --  CKMB -- 12.9* 8.8* -- --  TROPONINI -- 1.15* 0.90* -- --   Estimated Creatinine Clearance: 58 ml/min (by C-G formula based on Cr of 2.04).  Medical History: History reviewed. No pertinent past medical history.  Medications:  Scheduled:    . ampicillin-sulbactam (UNASYN) IV  3 g Intravenous Once  . antiseptic oral rinse  15 mL Mouth Rinse QID  . aspirin EC  81 mg Oral Daily  . chlorhexidine  15 mL Mouth Rinse BID  . etomidate  20 mg Intravenous Once  . fentaNYL      . fentaNYL  100 mcg Intravenous Once  . fentaNYL  100 mcg Intravenous Once  . fentaNYL  100 mcg Intravenous Once  . heparin  5,000 Units Subcutaneous Q8H  . LORazepam  2 mg Intravenous Once  . metoprolol  2.5 mg Intravenous Q6H  . midazolam  4 mg Intravenous Once  . midazolam  4 mg Intravenous Once  . midazolam      . midazolam  4 mg Intravenous Once  . pantoprazole (PROTONIX) IV  40 mg Intravenous QHS  . piperacillin-tazobactam (ZOSYN)  IV  3.375 g Intravenous Q8H  . SAILS Study - rosuvastatin / placebo (loading dose)  (PI-Wright)  40 mg Oral Once  . SAILS Study - rosuvastatin / placebo (PI-Wright)  20 mg Oral Daily  . sodium chloride  1,000 mL Intravenous Once  . sodium chloride  1,000 mL  Intravenous Once  . sodium chloride  1,000 mL Intravenous Once  . sodium chloride  1,000 mL Intravenous Once  . sodium chloride  500 mL Intravenous Once  . succinylcholine  200 mg Intravenous Once  . vancomycin  2,000 mg Intravenous Once  . vancomycin  1,000 mg Intravenous Q12H    Assessment: 41 yo male with sepsis, increasing cardiac markers, for Heparin  Goal of Therapy:  Heparin level 0.3-0.7 units/ml   Plan:  Heparin 4000 units IV bolus, then 1200 units/hr Check heparin level in 8 hours.  Eddie Candle 09/07/2011,6:52 AM

## 2011-09-07 NOTE — Progress Notes (Signed)
eLink Physician-Brief Progress Note Patient Name: Alan Koch DOB: May 31, 1970 MRN: [id number]  Date of Service  09/07/2011   HPI/Events of Note  Troponin is 0.9. Pt currently on ventilator, looks comfortable, vitals stable.   eICU Interventions  - Possible demand ischemia due to sepsis. - Will trend troponin. No anticoagulants for now. EKG in afternoon showed sinus tachy. Will repeat EKG now.      Catha Brow 09/07/2011, 12:52 AM

## 2011-09-07 NOTE — Progress Notes (Signed)
eLink Physician-Brief Progress Note Patient Name: Marshell Dilauro Fessel DOB: 26-Nov-1970 MRN: [id number]  Date of Service  09/07/2011   HPI/Events of Note  Troponin trending upward. EKG no changes   eICU Interventions  -Will start heparin drip, aspirin and beta blocker - Trying to reach cardiology, unable. Will keep trying.      Catha Brow 09/07/2011, 6:21 AM

## 2011-09-07 NOTE — Progress Notes (Signed)
ANTICOAGULATION CONSULT NOTE - Follow Up Consult  Pharmacy Consult for heparin Indication: chest pain/ACS  Allergies  Allergen Reactions  . Aleve (Naproxen Sodium) Hives   Patient Measurements: Height: 6' (182.9 cm) Weight: 212 lb 15.4 oz (96.6 kg) IBW/kg (Calculated) : 77.6  Heparin Dosing Weight: 96.6kg  Vital Signs: Temp: 99.8 F (37.7 C) (05/04 1845) Temp src: Core (Comment) (05/04 1500) BP: 112/68 mmHg (05/04 1800) Pulse Rate: 102  (05/04 1845)  Labs:  Basename 09/07/11 2200 09/07/11 1430 09/07/11 0443 09/07/11 0442 09/06/11 2353 09/06/11 1430 09/06/11 0905  HGB -- -- 11.1* -- -- 12.1* --  HCT -- -- 33.9* -- -- 37.4* 44.1  PLT -- -- 314 -- -- 328 464*  APTT -- -- -- -- -- 28 --  LABPROT -- -- -- -- -- 14.4 --  INR -- -- -- -- -- 1.10 --  HEPARINUNFRC 0.20* 0.19* -- -- -- -- --  CREATININE -- -- 2.04* -- -- 0.65 0.76  CKTOTAL -- -- 883* 885* 493* -- --  CKMB -- -- -- 12.9* 8.8* -- --  TROPONINI -- -- -- 1.15* 0.90* -- --   Estimated Creatinine Clearance: 58 ml/min (by C-G formula based on Cr of 2.04).  Assessment: 41 yo male with possible ACS/NSTEMI for Heparin Goal of Therapy:  Heparin level 0.3-0.7 units/ml   Plan:  Heparin 2000 units IV bolus, then increase heparin 1700 units/hr Check heparin level in 6 hours.  Eddie Candle 09/07/2011,11:44 PM
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
## 2011-09-07 NOTE — Progress Notes (Signed)
Name: Alan Koch MRN: [id number] DOB: 1970/12/20    LOS: 1 PCCM PROGRESS  NOTE  History of Present Illness: 41 yo found down and admitted for aspiration pneumonia, ARDS and sepsis.  Lines / Drains: 5/3  OETT >> 5/3  OGT>> 5/3  R IJ CVL >> 5/3  R rad A-line>>  Cultures: 5/3  Sputum >>  5/3  Blood >> 5/3  Urine>>  Antibiotics: 5/3  vanc>> 5/3  zoysn>>  Tests / Events: 5/3  Found down, intubated, admitted for suspected aspiration / ARDS /sepsis  Subjective: Minimal pressors requirements overnight.  Intermittently agitated.  Vital Signs: Temp:  [100.2 F (37.9 C)-103.8 F (39.9 C)] 100.9 F (38.3 C) (05/04 1030) Pulse Rate:  [88-157] 157  (05/04 1000) Resp:  [21-36] 30  (05/04 1030) BP: (82-141)/(51-107) 135/75 mmHg (05/04 1000) SpO2:  [90 %-100 %] 100 % (05/04 1030) Arterial Line BP: (79-154)/(37-79) 117/65 mmHg (05/04 1030) FiO2 (%):  [59.9 %-100 %] 60.1 % (05/04 1030) Weight:  [96.6 kg (212 lb 15.4 oz)] 96.6 kg (212 lb 15.4 oz) (05/04 0500) I/O last 3 completed shifts: In: 1810 [P.O.:20; I.V.:910; NG/GT:30; IV Piggyback:850] Out: 805 [Urine:805]  Physical Examination: General:  No acute distress Neuro:  Sedated, intermittently agitated, nonfocal  HEENT:  No JVD Cardiovascular:  Tachycardic, regular, no murmurs Lungs:  Bilateral air entry, few rhonchi Abdomen:  Bowel sounds present Musculoskeletal:  Moves all extremities Skin:  No rash  Ventilator settings: Vent Mode:  [-] PRVC FiO2 (%):  [59.9 %-100 %] 60.1 % Set Rate:  [27 bmp-35 bmp] 30 bmp Vt Set:  [540 mL-620 mL] 620 mL PEEP:  [10 cmH20-20 cmH20] 10 cmH20 Plateau Pressure:  [27 cmH20-37 cmH20] 28 cmH20  Labs and Imaging: Reviewed.  ASSESSMENT AND PLAN  NEUROLOGIC A:  Acute encephalopathy secondary to sedation.  Head CT >>> NAD. P: -->  Versed / Fentanyl gtt to goal RASS 0 to -1 -->  Daily WUA  PULMONARY  Lab 09/07/11 0443 09/06/11 1833 09/06/11 1800 09/06/11 1620 09/06/11 1513  09/06/11 1336  PHART 7.368 7.235* -- 7.256* 7.199* 7.183*  PCO2ART 42.7 58.6* -- 58.7* 68.1* 67.3*  PO2ART 79.0* 125.0* -- 50.0* 89.0 48.0*  HCO3 24.2* 24.0 -- 26.1* 26.6* 25.3*  O2SAT 94.0 97.0 81.1 78.0 94.0 --   5/3  CXR >>> tubes/lines good position, bilateral airspace disease   A:  Suspected aspiration pneumonia / pneumonitis / ARDS. P: -->  Full mechanical support - ARDS protocol -->  Goal pH>7.30, goal SpO2>92 -->  Follow up ABG -->  Follow up CXR -->  Bronchodilators -->  Daily SBT  CARDIOVASCULAR  Lab 09/07/11 0442 09/06/11 2353 09/06/11 1430 09/06/11 0905  TROPONINI 1.15* 0.90* -- --  LATICACIDVEN -- -- 3.1* 7.0*  PROBNP -- -- -- --   A:  Acute coronary syndrome (NSTEMI vs demand ischemia). P: -->  NS boluses to goal CVP>10 -->  Levophed to goal MAP>60 -->  Heparin gtt x 48 h -->  Unable to use BB and ACEI as hypotensive -->  Unable to use ASA as allergy to NSAID -->  Trend sepsis markers  RENAL  Lab 09/07/11 0443 09/06/11 1430 09/06/11 0905  NA 137 141 143  K 4.7 3.6 --  CL 103 107 99  CO2 22 25 29   BUN 22 6 5*  CREATININE 2.04* 0.65 0.76  CALCIUM 7.8* 7.9* 9.4  MG -- 1.2* --  PHOS -- 2.6 --   A:  Acute renal failure. P: -->  BMP, Mg, Phos  in AM -->  Mg level today.  GASTROINTESTINAL  Lab 09/07/11 0443 09/06/11 1430 09/06/11 0905  AST 41* 38* 40*  ALT 28 31 43  ALKPHOS -- 117 177*  BILITOT -- 0.2* 0.2*  PROT -- 5.4* 6.8  ALBUMIN -- 2.6* 3.4*   A:  No active issues. P: -->  GI Px  HEMATOLOGIC  Lab 09/07/11 0443 09/06/11 1430 09/06/11 0905  HGB 11.1* 12.1* 14.0  HCT 33.9* 37.4* 44.1  PLT 314 328 464*  INR -- 1.10 --  APTT -- 28 --   A:  Mild stable anemia.  No overt hemorrhage. P: -->  CBC in AM  INFECTIOUS  Lab 09/07/11 0443 09/06/11 1430 09/06/11 0912 09/06/11 0905  WBC 20.4* 7.4 -- 19.3*  PROCALCITON -- 32.19 12.79 --   A:  Suspected aspiration pneumonia. P: -->  Vancomycin / Zosyn as above -->  Follow  cultures  ENDOCRINE  Lab 09/07/11 0005 09/06/11 2039 09/06/11 1551 09/06/11 1219 09/06/11 0858  GLUCAP 95 98 92 109* 145*   A:  Hyperglycemia.  No history of DM.  Unknown adrenal / thyroid function. P: -->  CBG checks / SSI  BEST PRACTICE / DISPOSITION -->  ICU status under PCCM -->  Full code -->  NPO -->  DVT Px is not indicated (on Heparin gtt) -->  Protonix IV for GI Px -->  Ventilator bundle -->  Family updated at bedside  The patient is critically ill with multiple organ systems failure and requires high complexity decision making for assessment and support, frequent evaluation and titration of therapies, application of advanced monitoring technologies and extensive interpretation of multiple databases. Critical Care Time devoted to patient care services described in this note is 45 minutes.  Orlean Bradford, M.D., F.C.C.P. Pulmonary and Critical Care Medicine [facility] Cell: [phone] Pager: [phone]  09/07/2011, 10:40 AM

## 2011-09-07 NOTE — Progress Notes (Signed)
  Echocardiogram 2D Echocardiogram has been performed.  Thia Olesen, Real Cons 09/07/2011, 9:36 AM

## 2011-09-08 ENCOUNTER — Inpatient Hospital Stay (HOSPITAL_COMMUNITY): Payer: BC Managed Care – PPO

## 2011-09-08 DIAGNOSIS — J9589 Other postprocedural complications and disorders of respiratory system, not elsewhere classified: Secondary | ICD-10-CM

## 2011-09-08 DIAGNOSIS — R748 Abnormal levels of other serum enzymes: Secondary | ICD-10-CM

## 2011-09-08 DIAGNOSIS — N179 Acute kidney failure, unspecified: Secondary | ICD-10-CM

## 2011-09-08 LAB — CBC
HCT: 24.8 % — ABNORMAL LOW (ref 39.0–52.0)
Hemoglobin: 8.7 g/dL — ABNORMAL LOW (ref 13.0–17.0)
MCH: 29.1 pg (ref 26.0–34.0)
MCV: 82.9 fL (ref 78.0–100.0)
Platelets: 234 10*3/uL (ref 150–400)
RBC: 2.99 MIL/uL — ABNORMAL LOW (ref 4.22–5.81)
WBC: 15.5 10*3/uL — ABNORMAL HIGH (ref 4.0–10.5)

## 2011-09-08 LAB — GLUCOSE, CAPILLARY
Glucose-Capillary: 58 mg/dL — ABNORMAL LOW (ref 70–99)
Glucose-Capillary: 128 mg/dL — ABNORMAL HIGH (ref 70–99)
Glucose-Capillary: 84 mg/dL (ref 70–99)
Glucose-Capillary: 103 mg/dL — ABNORMAL HIGH (ref 70–99)
Glucose-Capillary: 98 mg/dL (ref 70–99)

## 2011-09-08 LAB — POCT I-STAT 3, ART BLOOD GAS (G3+)
TCO2: 25 mmol/L (ref 0–100)
pCO2 arterial: 34.2 mmHg — ABNORMAL LOW (ref 35.0–45.0)
pH, Arterial: 7.452 — ABNORMAL HIGH (ref 7.350–7.450)

## 2011-09-08 LAB — BASIC METABOLIC PANEL
BUN: 46 mg/dL — ABNORMAL HIGH (ref 6–23)
CO2: 22 mEq/L (ref 19–32)
Calcium: 7.7 mg/dL — ABNORMAL LOW (ref 8.4–10.5)
Chloride: 105 mEq/L (ref 96–112)
Creatinine, Ser: 3.62 mg/dL — ABNORMAL HIGH (ref 0.50–1.35)
Glucose, Bld: 145 mg/dL — ABNORMAL HIGH (ref 70–99)

## 2011-09-08 LAB — LEGIONELLA ANTIGEN, URINE

## 2011-09-08 MED ORDER — OXEPA PO LIQD
1000.0000 mL | ORAL | Status: DC
  Administered 2011-09-08 – 2011-09-09 (×2): 1000 mL
  Filled 2011-09-08 (×3): qty 1000

## 2011-09-08 MED ORDER — DEXTROSE 50 % IV SOLN
INTRAVENOUS | Status: AC
  Administered 2011-09-08: 50 mL
  Filled 2011-09-08: qty 50

## 2011-09-08 MED ORDER — VANCOMYCIN HCL 1000 MG IV SOLR
1250.0000 mg | INTRAVENOUS | Status: DC
  Filled 2011-09-08: qty 1250

## 2011-09-08 MED ORDER — SODIUM CHLORIDE 0.9 % IV SOLN
0.4000 ug/kg/h | INTRAVENOUS | Status: DC
  Administered 2011-09-08: 0.4 ug/kg/h via INTRAVENOUS
  Administered 2011-09-08 – 2011-09-09 (×2): 0.5 ug/kg/h via INTRAVENOUS
  Administered 2011-09-09: 0.4 ug/kg/h via INTRAVENOUS
  Filled 2011-09-08 (×5): qty 2
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
## 2011-09-08 NOTE — Progress Notes (Signed)
eLink Physician-Brief Progress Note Patient Name: Alan Koch DOB: 25-May-1970 MRN: [id number]  Date of Service  09/08/2011   HPI/Events of Note  Sugar 60. ARDS patient  eICU Interventions  Start oxepa nutrtion consult   Intervention Category Intermediate Interventions: Other:  [name] 09/08/2011, 12:08 AM

## 2011-09-08 NOTE — Progress Notes (Signed)
Subjective: Patient sedated on ventilator, but intermittently agitated.   Objective: Temp:  [99.6 F (37.6 C)-101 F (38.3 C)] 100.4 F (38 C) (05/05 0700) Pulse Rate:  [88-157] 96  (05/05 0700) Resp:  [24-30] 30  (05/05 0700) BP: (86-135)/(48-94) 122/81 mmHg (05/05 0830) SpO2:  [89 %-100 %] 99 % (05/05 0700) Arterial Line BP: (85-149)/(48-89) 125/68 mmHg (05/05 0700) FiO2 (%):  [39.7 %-60.3 %] 40.1 % (05/05 0700) Weight:  [216 lb 11.4 oz (98.3 kg)] 216 lb 11.4 oz (98.3 kg) (05/05 0600)  I/O last 3 completed shifts: In: 4010.8 [P.O.:20; I.V.:2108.3; NG/GT:160; IV Piggyback:1722.5] Out: 657 [Urine:657]  Telemetry - SInus tachycardia.  Exam -   General - Intermittently agitated.  Lungs - Course BS.  Cardiac - Tachy RR, no S3 or rub.  Abdomen - Decreased bowel sounds.  Extremities - No pitting.  Testing -   Lab Results  Component Value Date   WBC 15.5* 09/08/2011   HGB 8.7* 09/08/2011   HCT 24.8* 09/08/2011   MCV 82.9 09/08/2011   PLT 234 09/08/2011    Lab Results  Component Value Date   CREATININE 3.62* 09/08/2011   BUN 46* 09/08/2011   NA 139 09/08/2011   K 3.6 09/08/2011   CL 105 09/08/2011   CO2 22 09/08/2011    Lab Results  Component Value Date   CKTOTAL 883* 09/07/2011   CKMB 12.9* 09/07/2011   TROPONINI 1.15* 09/07/2011   Echocardiogram 5/4: Study Conclusions  - Procedure narrative: Transthoracic echocardiography. Image quality was poor. The study was technically difficult, as a result of poor acoustic windows. - Left ventricle: The cavity size was normal. Systolic function was normal. The estimated ejection fraction was in the range of 50% to 55%. Wall motion was normal; there were no regional wall motion abnormalities. The study is not technically sufficient to allow evaluation of LV diastolic function. - Right ventricle: RV contusion is possible. Systolic function was mildly reduced.   Current Medications    . antiseptic oral rinse  15 mL Mouth Rinse  QID  . aspirin      . aspirin  81 mg Per Tube Daily  . chlorhexidine  15 mL Mouth Rinse BID  . dextrose      . dextrose      . feeding supplement (OXEPA)  1,000 mL Per Tube Q24H  . heparin  1,400 Units Intravenous Once  . heparin  2,000 Units Intravenous Once  . insulin aspart  0-15 Units Subcutaneous Q4H  . pantoprazole (PROTONIX) IV  40 mg Intravenous QHS  . piperacillin-tazobactam (ZOSYN)  IV  3.375 g Intravenous Q8H  . SAILS Study - rosuvastatin / placebo (PI-Wright)  20 mg Oral Daily  . sodium chloride  1,000 mL Intravenous Once  . vancomycin  750 mg Intravenous Q12H  . DISCONTD: aspirin EC  81 mg Oral Daily  . DISCONTD: metoprolol  2.5 mg Intravenous Q6H  . DISCONTD: sodium chloride  500 mL Intravenous Once  . DISCONTD: vancomycin  1,000 mg Intravenous Q12H    Assessment:  1. NSTEMI, troponin to 1.1 and ECG nonspecific. Etiologies to consider - ACS, ARF, VDRF with PNA, question PE. Echocardiogram shows LVEF 50-55% without WMA. He does have some RV dysfunction.   2. ARF, creatinine up to 3.6.  3. VDRF with PNA.  4. Hypotension, improved, CVP 12. On Levophed.  Plan:  Continue ASA and Heparin with other supportive measures. No beta blocker as yet since on pressors. May also respond to IVF's with RV dysfunction.  Followup ECG AM.   Jonelle Sidle, M.D., F.A.C.C.

## 2011-09-08 NOTE — Progress Notes (Signed)
eLink Physician-Brief Progress Note Patient Name: Alan Koch DOB: 02/14/1971 MRN: [id number]  Date of Service  09/08/2011   HPI/Events of Note  On levophed  eICU Interventions  Dc lopressor order   Intervention Category Intermediate Interventions: Medication change / dose adjustment  [name] 09/08/2011, 12:45 AM

## 2011-09-08 NOTE — Progress Notes (Addendum)
ANTICOAGULATION CONSULT NOTE - Follow Up Consult  Pharmacy Consult for heparin, vancomycin  Indication: chest pain/ACS, possible aspiration PNA  Allergies  Allergen Reactions  . Aleve (Naproxen Sodium) Hives    Patient Measurements: Height: 6' (182.9 cm) Weight: 216 lb 11.4 oz (98.3 kg) IBW/kg (Calculated) : 77.6  Heparin Dosing Weight: 96.6kg  Vital Signs: Temp: 100.9 F (38.3 C) (05/05 1100) Temp src: Core (Comment) (05/05 0700) BP: 115/77 mmHg (05/05 1100) Pulse Rate: 91  (05/05 1100)  Labs:  Basename 09/08/11 0737 09/08/11 0600 09/08/11 0518 09/07/11 2200 09/07/11 1430 09/07/11 0443 09/07/11 0442 09/06/11 2353 09/06/11 1430  HGB -- -- 8.7* -- -- 11.1* -- -- --  HCT -- -- 24.8* -- -- 33.9* -- -- 37.4*  PLT -- -- 234 -- -- 314 -- -- 328  APTT -- -- -- -- -- -- -- -- 28  LABPROT -- -- -- -- -- -- -- -- 14.4  INR -- -- -- -- -- -- -- -- 1.10  HEPARINUNFRC -- 0.30 -- 0.20* 0.19* -- -- -- --  CREATININE -- -- 3.62* -- -- 2.04* -- -- 0.65  CKTOTAL 676* -- -- -- -- 883* 885* -- --  CKMB -- -- -- -- -- -- 12.9* 8.8* --  TROPONINI -- -- -- -- -- -- 1.15* 0.90* --   Estimated Creatinine Clearance: 33 ml/min (by C-G formula based on Cr of 3.62).  Medications:  Infusions:    . fentaNYL infusion INTRAVENOUS 300 mcg/hr (09/08/11 1610)  . heparin 1,700 Units/hr (09/08/11 0016)  . midazolam (VERSED) infusion 6 mg/hr (09/08/11 1100)  . norepinephrine (LEVOPHED) Adult infusion Stopped (09/08/11 0115)  . phenylephrine (NEO-SYNEPHRINE) Adult infusion 10 mcg/min (09/07/11 0130)  . DISCONTD: heparin 1,200 Units/hr (09/07/11 0740)   Assessment: 40 yom continues on IV heparin for NSTEMI with increased cardiac markers. Etiology isn't clear at this point. Heparin level is at goal at 0.3. No bleeding noted but H/H and plts are trending down. Will need to watch closely.  Also noted rapidly worsening renal function. Scr 0.65>>2.04>>3.62. Will hold vanc and check a level. Zosyn dose remains  ok.   Goal of Therapy:  Heparin level 0.3-0.7 units/ml Vanc trough 15-20   Plan:  1. Continue heparin gtt at 1700units/hr 2. DC current vanc dose - check "trough" this afternoon prior to previously scheduled dose 3. F/u AM heparin level 4. F/u renal function and plan  Tisa Weisel, Drake Leach 09/08/2011,11:39 AM

## 2011-09-09 ENCOUNTER — Inpatient Hospital Stay (HOSPITAL_COMMUNITY): Payer: BC Managed Care – PPO

## 2011-09-09 LAB — POCT I-STAT 3, ART BLOOD GAS (G3+)
O2 Saturation: 98 %
TCO2: 21 mmol/L (ref 0–100)
pCO2 arterial: 27.6 mmHg — ABNORMAL LOW (ref 35.0–45.0)

## 2011-09-09 LAB — VANCOMYCIN, RANDOM: Vancomycin Rm: 26.2 ug/mL

## 2011-09-09 LAB — CBC
HCT: 23.1 % — ABNORMAL LOW (ref 39.0–52.0)
Hemoglobin: 8.3 g/dL — ABNORMAL LOW (ref 13.0–17.0)
RBC: 2.87 MIL/uL — ABNORMAL LOW (ref 4.22–5.81)
RDW: 16.5 % — ABNORMAL HIGH (ref 11.5–15.5)
WBC: 18.1 10*3/uL — ABNORMAL HIGH (ref 4.0–10.5)

## 2011-09-09 LAB — CULTURE, RESPIRATORY W GRAM STAIN: Special Requests: NORMAL

## 2011-09-09 LAB — GLUCOSE, CAPILLARY
Glucose-Capillary: 96 mg/dL (ref 70–99)
Glucose-Capillary: 111 mg/dL — ABNORMAL HIGH (ref 70–99)
Glucose-Capillary: 54 mg/dL — ABNORMAL LOW (ref 70–99)

## 2011-09-09 LAB — CK: Total CK: 348 U/L — ABNORMAL HIGH (ref 7–232)

## 2011-09-09 LAB — BASIC METABOLIC PANEL
CO2: 19 mEq/L (ref 19–32)
GFR calc non Af Amer: 15 mL/min — ABNORMAL LOW (ref >90)
Glucose, Bld: 118 mg/dL — ABNORMAL HIGH (ref 70–99)
Potassium: 3.7 mEq/L (ref 3.5–5.1)
Sodium: 137 mEq/L (ref 135–145)

## 2011-09-09 MED ORDER — OXEPA PO LIQD
1000.0000 mL | ORAL | Status: DC
  Administered 2011-09-09: 1000 mL

## 2011-09-09 MED ORDER — MIDAZOLAM HCL 2 MG/2ML IJ SOLN
INTRAMUSCULAR | Status: AC
  Administered 2011-09-09: 2 mg via INTRAVENOUS
  Filled 2011-09-09: qty 2

## 2011-09-09 MED ORDER — VECURONIUM BROMIDE 10 MG IV SOLR
INTRAVENOUS | Status: AC
  Filled 2011-09-09: qty 10

## 2011-09-09 MED ORDER — SODIUM CHLORIDE 0.9 % IV SOLN: 3.0000 g | Freq: Four times a day (QID) | INTRAVENOUS | Status: DC

## 2011-09-09 MED ORDER — PROPOFOL 10 MG/ML IV EMUL
5.0000 ug/kg/min | INTRAVENOUS | Status: DC
  Administered 2011-09-09: 10 ug/kg/min via INTRAVENOUS
  Administered 2011-09-09 – 2011-09-10 (×2): 20 ug/kg/min via INTRAVENOUS
  Administered 2011-09-10 (×2): 25 ug/kg/min via INTRAVENOUS
  Administered 2011-09-11 (×2): 50 ug/kg/min via INTRAVENOUS
  Administered 2011-09-11: 40 ug/kg/min via INTRAVENOUS
  Administered 2011-09-11 (×2): 50 ug/kg/min via INTRAVENOUS
  Administered 2011-09-11 (×2): 40 ug/kg/min via INTRAVENOUS
  Administered 2011-09-12 (×2): 50 ug/kg/min via INTRAVENOUS
  Filled 2011-09-09 (×17): qty 100

## 2011-09-09 MED ORDER — HEPARIN SODIUM (PORCINE) 5000 UNIT/ML IJ SOLN
5000.0000 [IU] | Freq: Three times a day (TID) | INTRAMUSCULAR | Status: DC
  Administered 2011-09-09 – 2011-09-19 (×30): 5000 [IU] via SUBCUTANEOUS
  Filled 2011-09-09 (×33): qty 1

## 2011-09-09 MED ORDER — FENTANYL CITRATE 0.05 MG/ML IJ SOLN
INTRAMUSCULAR | Status: AC
  Administered 2011-09-09: 100 ug via INTRAVENOUS
  Filled 2011-09-09: qty 2

## 2011-09-09 MED ORDER — PRO-STAT SUGAR FREE PO LIQD
30.0000 mL | Freq: Every day | ORAL | Status: DC
  Administered 2011-09-09 – 2011-09-10 (×5): 30 mL
  Filled 2011-09-09 (×11): qty 30

## 2011-09-09 MED ORDER — PROPOFOL 10 MG/ML IV EMUL
INTRAVENOUS | Status: AC
  Administered 2011-09-09: 10 ug/kg/min via INTRAVENOUS
  Filled 2011-09-09: qty 100

## 2011-09-09 MED ORDER — METOCLOPRAMIDE HCL 5 MG/ML IJ SOLN
5.0000 mg | Freq: Three times a day (TID) | INTRAMUSCULAR | Status: DC
  Administered 2011-09-09 – 2011-09-10 (×3): 5 mg via INTRAVENOUS
  Filled 2011-09-09 (×6): qty 1

## 2011-09-09 MED ORDER — STUDY - INVESTIGATIONAL DRUG SIMPLE RECORD
10.0000 mg | Freq: Every day | Status: DC
  Administered 2011-09-10 – 2011-09-12 (×3): 10 mg via ORAL
  Filled 2011-09-09 (×2): qty 10

## 2011-09-09 MED ORDER — FENTANYL CITRATE 0.05 MG/ML IJ SOLN
50.0000 ug | INTRAMUSCULAR | Status: DC | PRN
  Administered 2011-09-09 – 2011-09-12 (×10): 100 ug via INTRAVENOUS
  Filled 2011-09-09 (×9): qty 2

## 2011-09-09 MED ORDER — DEXTROSE 50 % IV SOLN
INTRAVENOUS | Status: AC
  Administered 2011-09-09: 50 mL
  Filled 2011-09-09: qty 50

## 2011-09-09 MED ORDER — ETOMIDATE 2 MG/ML IV SOLN
INTRAVENOUS | Status: AC
  Filled 2011-09-09: qty 10

## 2011-09-09 MED ORDER — FUROSEMIDE 10 MG/ML IJ SOLN
40.0000 mg | Freq: Once | INTRAMUSCULAR | Status: AC
  Administered 2011-09-09: 40 mg via INTRAVENOUS
  Filled 2011-09-09: qty 4

## 2011-09-09 MED ORDER — MIDAZOLAM HCL 2 MG/2ML IJ SOLN
2.0000 mg | INTRAMUSCULAR | Status: DC | PRN
  Administered 2011-09-09 (×2): 2 mg via INTRAVENOUS
  Administered 2011-09-10: 4 mg via INTRAVENOUS
  Administered 2011-09-10 – 2011-09-12 (×5): 2 mg via INTRAVENOUS
  Filled 2011-09-09: qty 2
  Filled 2011-09-09: qty 4
  Filled 2011-09-09: qty 2
  Filled 2011-09-09: qty 4
  Filled 2011-09-09 (×4): qty 2

## 2011-09-09 MED ORDER — PANTOPRAZOLE SODIUM 40 MG PO PACK
40.0000 mg | PACK | Freq: Every day | ORAL | Status: DC
  Administered 2011-09-09 – 2011-09-11 (×3): 40 mg
  Filled 2011-09-09 (×5): qty 20

## 2011-09-09 MED ORDER — SODIUM CHLORIDE 0.9 % IV SOLN
3.0000 g | Freq: Two times a day (BID) | INTRAVENOUS | Status: AC
  Administered 2011-09-09 – 2011-09-13 (×9): 3 g via INTRAVENOUS
  Filled 2011-09-09 (×9): qty 3

## 2011-09-09 NOTE — Progress Notes (Signed)
ANTICOAGULATION CONSULT NOTE -  Pharmacy Consult for heparin Indication: chest pain/ACS  Allergies  Allergen Reactions  . Aleve (Naproxen Sodium) Hives    Patient Measurements: Height: 6' (182.9 cm) Weight: 219 lb 5.7 oz (99.5 kg) IBW/kg (Calculated) : 77.6  Heparin Dosing Weight: 96.6kg  Vital Signs: Temp: 98.7 F (37.1 C) (05/06 0400) Temp src: Core (Comment) (05/06 0400) BP: 141/87 mmHg (05/06 0400) Pulse Rate: 53  (05/06 0400)  Labs:  Basename 09/09/11 0450 09/08/11 0737 09/08/11 0600 09/08/11 0518 09/07/11 2200 09/07/11 0443 09/07/11 0442 09/06/11 2353 09/06/11 1430  HGB 8.3* -- -- 8.7* -- -- -- -- --  HCT 23.1* -- -- 24.8* -- 33.9* -- -- --  PLT 201 -- -- 234 -- 314 -- -- --  APTT -- -- -- -- -- -- -- -- 28  LABPROT -- -- -- -- -- -- -- -- 14.4  INR -- -- -- -- -- -- -- -- 1.10  HEPARINUNFRC <0.10* -- 0.30 -- 0.20* -- -- -- --  CREATININE 4.45* -- -- 3.62* -- 2.04* -- -- --  CKTOTAL 348* 676* -- -- -- 883* -- -- --  CKMB -- -- -- -- -- -- 12.9* 8.8* --  TROPONINI -- -- -- -- -- -- 1.15* 0.90* --   Estimated Creatinine Clearance: 27 ml/min (by C-G formula based on Cr of 4.45).  Assessment: 41 yo male withr increased cardiac markers for Heparin  Heparin infusing OK per RN   Goal of Therapy:  Heparin level 0.3-0.7   Plan:  Increase Heparin 2000 units/hr Check heparin level in 8 hours.  Eddie Candle 09/09/2011,5:46 AM

## 2011-09-09 NOTE — Progress Notes (Signed)
   Subjective: Patient sedated on ventilator.   Objective: Temp:  [98.7 F (37.1 C)-100.9 F (38.3 C)] 99.1 F (37.3 C) (05/06 0600) Pulse Rate:  [52-97] 54  (05/06 0600) Resp:  [30] 30  (05/06 0600) BP: (114-144)/(76-114) 142/114 mmHg (05/06 0600) SpO2:  [98 %-100 %] 100 % (05/06 0600) Arterial Line BP: (130-159)/(72-90) 156/88 mmHg (05/06 0600) FiO2 (%):  [39.6 %-40.2 %] 40 % (05/06 0600) Weight:  [99.5 kg (219 lb 5.7 oz)] 99.5 kg (219 lb 5.7 oz) (05/06 0400)  I/O last 3 completed shifts: In: 2879.9 [I.V.:1817.4; NG/GT:730; IV Piggyback:332.5] Out: 727 [Urine:727]  Telemetry - SInus bradycardia.  Exam -   General - Intubated, sedated  Lungs - Diffuse rhonchi  Cardiac - Tachy RR, no S3 or rub.  Abdomen - Decreased bowel sounds; soft  Extremities - 1+ ankle edema  Testing -   Lab Results  Component Value Date   WBC 18.1* 09/09/2011   HGB 8.3* 09/09/2011   HCT 23.1* 09/09/2011   MCV 80.5 09/09/2011   PLT 201 09/09/2011    Lab Results  Component Value Date   CREATININE 4.45* 09/09/2011   BUN 63* 09/09/2011   NA 137 09/09/2011   K 3.7 09/09/2011   CL 104 09/09/2011   CO2 19 09/09/2011    Lab Results  Component Value Date   CKTOTAL 348* 09/09/2011   CKMB 12.9* 09/07/2011   TROPONINI 1.15* 09/07/2011   Echocardiogram 5/4: Study Conclusions  - Procedure narrative: Transthoracic echocardiography. Image quality was poor. The study was technically difficult, as a result of poor acoustic windows. - Left ventricle: The cavity size was normal. Systolic function was normal. The estimated ejection fraction was in the range of 50% to 55%. Wall motion was normal; there were no regional wall motion abnormalities. The study is not technically sufficient to allow evaluation of LV diastolic function. - Right ventricle: RV contusion is possible. Systolic function was mildly reduced.   Current Medications    . antiseptic oral rinse  15 mL Mouth Rinse QID  . aspirin  81 mg Per Tube  Daily  . chlorhexidine  15 mL Mouth Rinse BID  . feeding supplement (OXEPA)  1,000 mL Per Tube Q24H  . insulin aspart  0-15 Units Subcutaneous Q4H  . pantoprazole (PROTONIX) IV  40 mg Intravenous QHS  . piperacillin-tazobactam (ZOSYN)  IV  3.375 g Intravenous Q8H  . SAILS Study - rosuvastatin / placebo (PI-Wright)  20 mg Oral Daily  . DISCONTD: vancomycin  1,250 mg Intravenous Q24H  . DISCONTD: vancomycin  750 mg Intravenous Q12H    Assessment:  1. Elevated troponin to 1.1. Etiologies to consider - ACS, ARF, VDRF with PNA, question PE. Echocardiogram shows LVEF 50-55% without WMA. He does have some RV dysfunction. I doubt this represents an acute coronary syndrome; could change heparin to DVT prophylaxis dose if CCM feels pulmonary embolus unlikely. Plan functional study when he recovers from present illness. Continue ASA.  2. ARF- most likely from ATN; may need nephrology consult as renal function continues to deteriorate.  3. VDRF with PNA. Vent management and antibiotics per CCM  4. Encephalopathy  5. Acute anemia-no evidence of active bleeding; transfuse PRN; follow.    Olga Millers, M.D., F.A.C.C.

## 2011-09-09 NOTE — Progress Notes (Signed)
Nutrition Follow-up  Diet Order:  NPO, Consult for TF initiation and management  Patient remains sedated and intubated on mechanical ventilation. Per RN TF of Oxepa had been initiated and was running at 20 ml/hr with positive tolerance but has been held for a procedure. Per RN residuals were 100cc. Propofol currently running at 11.9 ml/hr, provides 314.16 kcal daily.   Meds: Scheduled Meds:   . ampicillin-sulbactam (UNASYN) IV  3 g Intravenous Q12H  . antiseptic oral rinse  15 mL Mouth Rinse QID  . aspirin  81 mg Per Tube Daily  . chlorhexidine  15 mL Mouth Rinse BID  . feeding supplement (OXEPA)  1,000 mL Per Tube Q24H  . furosemide  40 mg Intravenous Once  . heparin subcutaneous  5,000 Units Subcutaneous Q8H  . insulin aspart  0-15 Units Subcutaneous Q4H  . metoCLOPramide (REGLAN) injection  5 mg Intravenous Q8H  . pantoprazole sodium  40 mg Per Tube Q1200  . SAILS Study - rosuvastatin / placebo (PI-Wright)  10 mg Oral Daily  . DISCONTD: ampicillin-sulbactam (UNASYN) IV  3 g Intravenous Q6H  . DISCONTD: pantoprazole (PROTONIX) IV  40 mg Intravenous QHS  . DISCONTD: piperacillin-tazobactam (ZOSYN)  IV  3.375 g Intravenous Q8H  . DISCONTD: SAILS Study - rosuvastatin / placebo (PI-Wright)  20 mg Oral Daily   Continuous Infusions:   . propofol 20 mcg/kg/min (09/09/11 1146)  . DISCONTD: dexmedetomidine (PRECEDEX) IV infusion 0.7 mcg/kg/hr (09/09/11 0800)  . DISCONTD: fentaNYL infusion INTRAVENOUS 300 mcg/hr (09/08/11 1610)  . DISCONTD: heparin 2,000 Units/hr (09/09/11 0555)  . DISCONTD: midazolam (VERSED) infusion 6 mg/hr (09/08/11 1500)  . DISCONTD: norepinephrine (LEVOPHED) Adult infusion Stopped (09/08/11 0115)  . DISCONTD: phenylephrine (NEO-SYNEPHRINE) Adult infusion 10 mcg/min (09/07/11 0130)   PRN Meds:.sodium chloride, fentaNYL, midazolam, DISCONTD: fentaNYL, DISCONTD: midazolam  Labs:  CMP     Component Value Date/Time   NA 137 09/09/2011 0450   K 3.7 09/09/2011 0450   CL  104 09/09/2011 0450   CO2 19 09/09/2011 0450   GLUCOSE 118* 09/09/2011 0450   BUN 63* 09/09/2011 0450   CREATININE 4.45* 09/09/2011 0450   CALCIUM 8.5 09/09/2011 0450   PROT 5.4* 09/06/2011 1430   ALBUMIN 2.6* 09/06/2011 1430   AST 64* 09/09/2011 0450   ALT 37 09/09/2011 0450   ALKPHOS 117 09/06/2011 1430   BILITOT 0.2* 09/06/2011 1430   GFRNONAA 15* 09/09/2011 0450   GFRAA 18* 09/09/2011 0450     Intake/Output Summary (Last 24 hours) at 09/09/11 1239 Last data filed at 09/09/11 1200  Gross per 24 hour  Intake 1364.94 ml  Output    617 ml  Net 747.94 ml   Wt Readings from Last 10 Encounters:  09/09/11 219 lb 5.7 oz (99.5 kg)   * Question accuracy of weight obtain on 5/3 of 250 lb.   Weight Status: 219 lb.  Ideal Wt: 78.2 kg % Ideal: 127.3%   BMI: 30.54 kg/m^2 ( Obesity Class I)  Re-estimated needs:   Enteral nutrition to provide 60-70% of estimated calorie needs (22-25 kcals/kg ideal body weight) and 100% of estimated protein needs, based on ASPEN guidelines for permissive underfeeding in critically ill obese individuals with BMI > 30.  ASPEN guideline estimated kcal needs: 1720-1954 kcal 120-140 grams protein, 2.1-2.4 L/d of fluid  Nutrition Dx:  Inadequate oral intake (NI-2.1). Status: Ongoing  Goal:  1. While intubated, intake to meet 60-70% of estimated calorie needs (22-25 kcals/kg ideal body weight) and 100% of estimated protein needs, based on  ASPEN guidelines for permissive underfeeding in critically ill obese individuals. -Not yet meeting, continue. 2. Positive tolerance of TF at goal rate.   Intervention:  Re-initiate TF of Oxepa via NG tube at 20 ml/hr and increase by 10 every 4 hours to a goal rate of 30 ml/hr with Prostat 30 ml 6 times daily to provide 1680 kcal, 135 grams protein, and 568.8 ml free water daily.   -With current propofol rate of 11.9 ml/hr providing 314 kcal TOTAL intake providing 1994 kcal (meets 102% of estimated energy needs) and 135 grams of protein (meets 100% of  estimated protein needs).    Monitor:  TF initiation, TF tolerance, diet advancements, labs, weight trends, I/O's.  RD to follow for nutrition plan of care. Will monitor propofol administration and adjust TF volume to meet calorie needs accordingly.    [place], [place] [facility] Pager #:  [phone]
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
## 2011-09-09 NOTE — Plan of Care (Signed)
Problem: Inadequate Intake (NI-2.1) Goal: Food and/or nutrient delivery Individualized approach for food/nutrient provision.  Outcome: Progressing TF was initiated and running at 20 ml/hr. TF held for procedure. Consulted for TF initiation and management. TF to start at 20 ml/hr and increase to goal rate of 30 ml/hr after procedure.

## 2011-09-09 NOTE — Progress Notes (Signed)
UR Completed.  Alan Koch Jane [phone] 09/09/2011  

## 2011-09-09 NOTE — Procedures (Signed)
Bronchoscopy Procedure Note Alan Koch [id number] 1970-10-26  Procedure: Bronchoscopy Indications: Diagnostic evaluation of the airways  Procedure Details Consent: Risks of procedure as well as the alternatives and risks of each were explained to the (patient/caregiver).  Consent for procedure obtained. Time Out: Verified patient identification, verified procedure, site/side was marked, verified correct patient position, special equipment/implants available, medications/allergies/relevent history reviewed, required imaging and test results available.  Performed  In preparation for procedure, patient was given 100% FiO2. Sedation:  Prior continous prop Airway entered and the following bronchi were examined: RUL, RML, RLL, LUL, LLL and Bronchi.   Procedures performed: Brushings performed Bronchoscope removed.    Evaluation Hemodynamic Status: BP stable throughout; O2 sats: stable throughout Patient's Current Condition: stable Specimens:  None Complications: No apparent complications Patient did tolerate procedure well.   Alan Koch. 09/09/2011  1. ETt open 2. Mucous mild NI, RLL collapsed, lingula mild pus, al suctioned clean

## 2011-09-10 ENCOUNTER — Inpatient Hospital Stay (HOSPITAL_COMMUNITY): Payer: BC Managed Care – PPO

## 2011-09-10 DIAGNOSIS — J690 Pneumonitis due to inhalation of food and vomit: Secondary | ICD-10-CM

## 2011-09-10 LAB — COMPREHENSIVE METABOLIC PANEL
AST: 33 U/L (ref 0–37)
CO2: 21 mEq/L (ref 19–32)
Calcium: 8.4 mg/dL (ref 8.4–10.5)
Creatinine, Ser: 4.79 mg/dL — ABNORMAL HIGH (ref 0.50–1.35)
GFR calc Af Amer: 16 mL/min — ABNORMAL LOW (ref >90)
GFR calc non Af Amer: 14 mL/min — ABNORMAL LOW (ref >90)
Glucose, Bld: 99 mg/dL (ref 70–99)

## 2011-09-10 LAB — CBC
MCH: 28.8 pg (ref 26.0–34.0)
MCV: 82.6 fL (ref 78.0–100.0)
Platelets: 245 10*3/uL (ref 150–400)
RBC: 2.81 MIL/uL — ABNORMAL LOW (ref 4.22–5.81)

## 2011-09-10 LAB — DIFFERENTIAL
Basophils Absolute: 0 10*3/uL (ref 0.0–0.1)
Lymphocytes Relative: 14 % (ref 12–46)
Monocytes Relative: 10 % (ref 3–12)
Neutrophils Relative %: 75 % (ref 43–77)

## 2011-09-10 LAB — GLUCOSE, CAPILLARY
Glucose-Capillary: 101 mg/dL — ABNORMAL HIGH (ref 70–99)
Glucose-Capillary: 70 mg/dL (ref 70–99)
Glucose-Capillary: 84 mg/dL (ref 70–99)
Glucose-Capillary: 89 mg/dL (ref 70–99)
Glucose-Capillary: 98 mg/dL (ref 70–99)

## 2011-09-10 MED ORDER — POTASSIUM CHLORIDE 20 MEQ/15ML (10%) PO LIQD
40.0000 meq | Freq: Once | ORAL | Status: AC
  Administered 2011-09-10: 40 meq
  Filled 2011-09-10: qty 30

## 2011-09-10 MED ORDER — RISPERIDONE 1 MG/ML PO SOLN
1.0000 mg | Freq: Two times a day (BID) | ORAL | Status: DC
  Administered 2011-09-10 (×2): 1 mg
  Filled 2011-09-10 (×4): qty 1

## 2011-09-10 MED ORDER — FUROSEMIDE 10 MG/ML IJ SOLN
80.0000 mg | Freq: Two times a day (BID) | INTRAMUSCULAR | Status: DC
  Administered 2011-09-10 (×2): 80 mg via INTRAVENOUS
  Filled 2011-09-10 (×4): qty 8

## 2011-09-10 MED ORDER — METOCLOPRAMIDE HCL 5 MG/ML IJ SOLN
5.0000 mg | Freq: Two times a day (BID) | INTRAMUSCULAR | Status: DC
  Administered 2011-09-10 – 2011-09-11 (×3): 5 mg via INTRAVENOUS
  Filled 2011-09-10 (×5): qty 1

## 2011-09-10 NOTE — Progress Notes (Signed)
eLink Physician-Brief Progress Note Patient Name: Alan Koch DOB: 01-Mar-1971 MRN: [id number]  Date of Service  09/10/2011   HPI/Events of Note   rass +3. rn SAYS he gets more agitated with tube feeds  eICU Interventions  Dc tube feeds Hold off extubation today   Intervention Category Major Interventions: Delirium, psychosis, severe agitation - evaluation and management Intermediate Interventions: Respiratory distress - evaluation and management  Honesti Seaberg 09/10/2011, 4:41 PM
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
## 2011-09-11 ENCOUNTER — Inpatient Hospital Stay (HOSPITAL_COMMUNITY): Payer: BC Managed Care – PPO

## 2011-09-11 LAB — CBC
MCH: 28.7 pg (ref 26.0–34.0)
MCH: 28.3 pg (ref 26.0–34.0)
MCHC: 34.5 g/dL (ref 30.0–36.0)
MCHC: 34.2 g/dL (ref 30.0–36.0)
MCV: 83.1 fL (ref 78.0–100.0)
MCV: 82.5 fL (ref 78.0–100.0)
Platelets: 292 10*3/uL (ref 150–400)
Platelets: 274 10*3/uL (ref 150–400)
RBC: 2.72 MIL/uL — ABNORMAL LOW (ref 4.22–5.81)
RDW: 16.6 % — ABNORMAL HIGH (ref 11.5–15.5)

## 2011-09-11 LAB — GLUCOSE, CAPILLARY
Glucose-Capillary: 90 mg/dL (ref 70–99)
Glucose-Capillary: 83 mg/dL (ref 70–99)

## 2011-09-11 LAB — CULTURE, BLOOD (ROUTINE X 2): Culture: NO GROWTH

## 2011-09-11 LAB — DIFFERENTIAL
Basophils Absolute: 0.1 10*3/uL (ref 0.0–0.1)
Basophils Absolute: 0.1 10*3/uL (ref 0.0–0.1)
Eosinophils Absolute: 0.1 10*3/uL (ref 0.0–0.7)
Eosinophils Relative: 1 % (ref 0–5)
Lymphocytes Relative: 16 % (ref 12–46)
Lymphs Abs: 2.1 10*3/uL (ref 0.7–4.0)
Monocytes Absolute: 1.5 10*3/uL — ABNORMAL HIGH (ref 0.1–1.0)
Monocytes Relative: 14 % — ABNORMAL HIGH (ref 3–12)

## 2011-09-11 LAB — COMPREHENSIVE METABOLIC PANEL
AST: 30 U/L (ref 0–37)
CO2: 20 mEq/L (ref 19–32)
Calcium: 8.6 mg/dL (ref 8.4–10.5)
Creatinine, Ser: 4.86 mg/dL — ABNORMAL HIGH (ref 0.50–1.35)
GFR calc Af Amer: 16 mL/min — ABNORMAL LOW (ref >90)
GFR calc non Af Amer: 14 mL/min — ABNORMAL LOW (ref >90)
Total Protein: 6 g/dL (ref 6.0–8.3)

## 2011-09-11 MED ORDER — POTASSIUM CHLORIDE 20 MEQ/15ML (10%) PO LIQD
40.0000 meq | Freq: Once | ORAL | Status: AC
  Administered 2011-09-11: 40 meq
  Filled 2011-09-11: qty 30

## 2011-09-11 MED ORDER — RISPERIDONE 1 MG/ML PO SOLN
2.0000 mg | Freq: Two times a day (BID) | ORAL | Status: DC
  Administered 2011-09-11 – 2011-09-12 (×3): 2 mg
  Filled 2011-09-11 (×6): qty 2

## 2011-09-11 MED ORDER — POTASSIUM CHLORIDE 20 MEQ/15ML (10%) PO LIQD
ORAL | Status: AC
  Administered 2011-09-11: 40 meq
  Filled 2011-09-11: qty 30

## 2011-09-11 MED ORDER — FUROSEMIDE 10 MG/ML IJ SOLN
60.0000 mg | Freq: Two times a day (BID) | INTRAMUSCULAR | Status: AC
  Administered 2011-09-11 (×2): 60 mg via INTRAVENOUS
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
## 2011-09-11 NOTE — Progress Notes (Signed)
Pt tolerating 15/5 at this time

## 2011-09-11 NOTE — Progress Notes (Signed)
Pt had a trail cpap, failed due to incfeased rr of >50, pt very agitated and moving and trying to get out of the bed, changed back to full support per MD and will reaccess pt after clean up and sedation

## 2011-09-12 ENCOUNTER — Inpatient Hospital Stay (HOSPITAL_COMMUNITY): Payer: BC Managed Care – PPO

## 2011-09-12 ENCOUNTER — Encounter (HOSPITAL_COMMUNITY): Payer: Self-pay

## 2011-09-12 LAB — BASIC METABOLIC PANEL
GFR calc Af Amer: 16 mL/min — ABNORMAL LOW (ref >90)
GFR calc non Af Amer: 14 mL/min — ABNORMAL LOW (ref >90)
Glucose, Bld: 85 mg/dL (ref 70–99)
Potassium: 3.2 mEq/L — ABNORMAL LOW (ref 3.5–5.1)
Sodium: 147 mEq/L — ABNORMAL HIGH (ref 135–145)

## 2011-09-12 LAB — CK: Total CK: 53 U/L (ref 7–232)

## 2011-09-12 LAB — CULTURE, BLOOD (ROUTINE X 2)
Culture  Setup Time: 201305032230
Culture  Setup Time: 201305032230
Culture: NO GROWTH

## 2011-09-12 LAB — CBC
Hemoglobin: 7.9 g/dL — ABNORMAL LOW (ref 13.0–17.0)
MCHC: 34.2 g/dL (ref 30.0–36.0)
Platelets: 349 10*3/uL (ref 150–400)

## 2011-09-12 LAB — GLUCOSE, CAPILLARY
Glucose-Capillary: 87 mg/dL (ref 70–99)
Glucose-Capillary: 96 mg/dL (ref 70–99)
Glucose-Capillary: 77 mg/dL (ref 70–99)
Glucose-Capillary: 83 mg/dL (ref 70–99)

## 2011-09-12 LAB — C-REACTIVE PROTEIN: CRP: 14.56 mg/dL — ABNORMAL HIGH (ref <0.60)

## 2011-09-12 LAB — MAGNESIUM: Magnesium: 2.5 mg/dL (ref 1.5–2.5)

## 2011-09-12 LAB — PHOSPHORUS: Phosphorus: 6.7 mg/dL — ABNORMAL HIGH (ref 2.3–4.6)

## 2011-09-12 MED ORDER — FUROSEMIDE 10 MG/ML IJ SOLN
60.0000 mg | Freq: Two times a day (BID) | INTRAMUSCULAR | Status: AC
  Administered 2011-09-12: 60 mg via INTRAVENOUS
  Administered 2011-09-12: 20 mg via INTRAVENOUS
  Filled 2011-09-12 (×2): qty 6

## 2011-09-12 MED ORDER — POTASSIUM CHLORIDE 10 MEQ/50ML IV SOLN
10.0000 meq | INTRAVENOUS | Status: AC
  Administered 2011-09-12 (×4): 10 meq via INTRAVENOUS
  Filled 2011-09-12 (×4): qty 50

## 2011-09-12 MED ORDER — HALOPERIDOL LACTATE 5 MG/ML IJ SOLN
5.0000 mg | Freq: Once | INTRAMUSCULAR | Status: AC
  Administered 2011-09-12: 5 mg via INTRAVENOUS

## 2011-09-12 MED ORDER — ACETAMINOPHEN 160 MG/5ML PO SOLN: 975.0000 mg | ORAL | Status: DC

## 2011-09-12 MED ORDER — HALOPERIDOL LACTATE 5 MG/ML IJ SOLN
INTRAMUSCULAR | Status: AC
  Filled 2011-09-12: qty 1
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
## 2011-09-12 NOTE — Procedures (Signed)
Extubation Procedure Note  Patient Details:   Name: Windle Huebert Dart DOB: [birth year] MRN: [id number]   Airway Documentation:  AIRWAYS 7.5 mm (Active)  Secured at (cm) 24 cm 09/06/2011 12:00 AM     Airway 7.5 mm (Active)  Secured at (cm) 22 cm 09/12/2011  7:33 AM  Measured From Lips 09/12/2011  7:33 AM  Secured Location Center 09/12/2011  7:33 AM  Secured By [employer] 09/12/2011  7:33 AM  Tube Holder Repositioned Yes 09/12/2011  7:33 AM  Cuff Pressure (cm H2O) 24 cm H2O 09/12/2011  7:33 AM  Site Condition Dry 09/11/2011  3:50 PM    Evaluation  O2 sats: stable throughout Complications: No apparent complications Patient did tolerate procedure well. Bilateral Breath Sounds: Diminished (coarse) Suctioning: Oral;Airway Yes  Ok Anis, MA 09/12/2011, 9:24 AM

## 2011-09-12 NOTE — Progress Notes (Deleted)
eLink Physician-Brief Progress Note Patient Name: Alan Koch DOB: 12/02/1970 MRN: [id number]  Date of Service  09/12/2011   HPI/Events of Note   Fever family does not want this at terminal wean  eICU Interventions  Liquid tylenol via tube   Intervention Category Major Interventions: Other:  Alan Koch 09/12/2011, 5:17 PM

## 2011-09-12 NOTE — Progress Notes (Signed)
Asked by RN to assess ETT position, patient has been restless, reaching up at tubes.  Tube noted to be secured 17cm at the lip.  Cuff deflated,  tube advanced back to 21 cm and resecured with ETAD device.  Patient restless throughout.

## 2011-09-12 NOTE — Progress Notes (Signed)
eLink Physician-Brief Progress Note Patient Name: Dvon Jiles Steckman DOB: Sep 19, 1970 MRN: [id number]  Date of Service  09/12/2011   HPI/Events of Note   Agitated per RN . RASS +1 and somewhat confused  eICU Interventions  Haldol 5mg  IV x 1   Intervention Category Intermediate Interventions: Change in mental status - evaluation and management  Delquan Poucher 09/12/2011, 7:53 PM

## 2011-09-13 ENCOUNTER — Inpatient Hospital Stay (HOSPITAL_COMMUNITY): Payer: BC Managed Care – PPO

## 2011-09-13 DIAGNOSIS — F1910 Other psychoactive substance abuse, uncomplicated: Secondary | ICD-10-CM

## 2011-09-13 LAB — GLUCOSE, CAPILLARY: Glucose-Capillary: 80 mg/dL (ref 70–99)

## 2011-09-13 LAB — DIFFERENTIAL
Band Neutrophils: 2 % (ref 0–10)
Basophils Absolute: 0 10*3/uL (ref 0.0–0.1)
Basophils Relative: 0 % (ref 0–1)
Blasts: 0 %
Lymphs Abs: 3 10*3/uL (ref 0.7–4.0)
Metamyelocytes Relative: 2 %
Monocytes Absolute: 0.2 10*3/uL (ref 0.1–1.0)
Promyelocytes Absolute: 0 %

## 2011-09-13 LAB — BASIC METABOLIC PANEL
BUN: 86 mg/dL — ABNORMAL HIGH (ref 6–23)
CO2: 19 mEq/L (ref 19–32)
Calcium: 8.6 mg/dL (ref 8.4–10.5)
GFR calc non Af Amer: 15 mL/min — ABNORMAL LOW (ref >90)
Glucose, Bld: 86 mg/dL (ref 70–99)

## 2011-09-13 LAB — CBC
HCT: 22.9 % — ABNORMAL LOW (ref 39.0–52.0)
Hemoglobin: 7.9 g/dL — ABNORMAL LOW (ref 13.0–17.0)
MCH: 28.4 pg (ref 26.0–34.0)
MCHC: 34.5 g/dL (ref 30.0–36.0)
MCV: 82.4 fL (ref 78.0–100.0)
RBC: 2.78 MIL/uL — ABNORMAL LOW (ref 4.22–5.81)

## 2011-09-13 LAB — PHOSPHORUS: Phosphorus: 7.5 mg/dL — ABNORMAL HIGH (ref 2.3–4.6)

## 2011-09-13 MED ORDER — FUROSEMIDE 10 MG/ML IJ SOLN
40.0000 mg | Freq: Once | INTRAMUSCULAR | Status: AC
  Administered 2011-09-13: 40 mg via INTRAVENOUS
  Filled 2011-09-13: qty 4

## 2011-09-13 MED ORDER — DIPHENHYDRAMINE HCL 50 MG/ML IJ SOLN
INTRAMUSCULAR | Status: AC
  Filled 2011-09-13: qty 1

## 2011-09-13 MED ORDER — EPINEPHRINE HCL 0.1 MG/ML IJ SOLN
INTRAMUSCULAR | Status: AC
  Filled 2011-09-13: qty 10

## 2011-09-13 MED ORDER — BENZTROPINE MESYLATE 1 MG/ML IJ SOLN
0.5000 mg | Freq: Once | INTRAMUSCULAR | Status: AC
  Administered 2011-09-13: 0.5 mg via INTRAVENOUS
  Filled 2011-09-13: qty 0.5

## 2011-09-13 MED ORDER — STUDY - INVESTIGATIONAL DRUG SIMPLE RECORD
40.0000 mg | Freq: Once | Status: DC
  Filled 2011-09-13: qty 40

## 2011-09-13 MED ORDER — PANTOPRAZOLE SODIUM 40 MG IV SOLR
40.0000 mg | INTRAVENOUS | Status: DC
  Administered 2011-09-13 – 2011-09-14 (×2): 40 mg via INTRAVENOUS
  Filled 2011-09-13 (×3): qty 40

## 2011-09-13 MED ORDER — STUDY - INVESTIGATIONAL DRUG SIMPLE RECORD
10.0000 mg | Freq: Every day | Status: DC
  Filled 2011-09-13: qty 10

## 2011-09-13 MED ORDER — POTASSIUM CHLORIDE 10 MEQ/50ML IV SOLN
10.0000 meq | INTRAVENOUS | Status: AC
  Administered 2011-09-13 (×4): 10 meq via INTRAVENOUS
  Filled 2011-09-13: qty 150
  Filled 2011-09-13: qty 50
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
## 2011-09-13 NOTE — Consult Note (Signed)
Clinical Social Work: Psychiatry  Aware of psych consult and ?overdose, however patient is unable to accurately be assessed due to medical acuity and problems, thus will hold consult and re-attempt to see patient either over the weekend or early next week.  Will remain on case and will follow.  Ashley Jacobs, MSW LCSW [phone]

## 2011-09-13 NOTE — Evaluation (Signed)
Physical Therapy Evaluation Patient Details Name: Alan Koch MRN: [id number] DOB: 1970/10/23 Today's Date: 09/13/2011 Time: 6578-4696 PT Time Calculation (min): 32 min  PT Assessment / Plan / Recommendation Clinical Impression  Pt s/p narcotic abuse found down with ARDS and sepsis s/p VDRF. Pt will benefit from acute therapy to maximize mobility, gait, transfers, balance and safety prior to discharge to increase function and decrease burden of care.    PT Assessment  Patient needs continued PT services    Follow Up Recommendations  Inpatient Rehab    Barriers to Discharge Decreased caregiver support wife works and unsure if family could provide 24supervision    lEquipment Recommendations  Defer to next venue    Recommendations for Other Services OT consult;Speech consult;Rehab consult   Frequency Min 3X/week    Precautions / Restrictions Precautions Precautions: Fall Precaution Comments: suicide   Pertinent Vitals/Pain Vital signs stable on RA throughout      Mobility  Bed Mobility Bed Mobility: Sitting - Scoot to Edge of Bed;Supine to Sit Supine to Sit: 1: +1 Total assist;HOB elevated Sitting - Scoot to Edge of Bed: 2: Max assist Details for Bed Mobility Assistance: Pt with HOB elevated 30degrees on arrival and total assist to pivot bil LE to EOB and max assist to elevate trunk in semisidely left from surface with max multimodal cueing. Max assist with pad to fully scoot to EOB Transfers Transfers: Sit to Stand;Stand to Sit;Stand Pivot Transfers Sit to Stand: 1: +1 Total assist;From bed;From chair/3-in-1;From elevated surface Stand to Sit: To chair/3-in-1;2: Max assist Stand Pivot Transfers: 2: Max assist Details for Transfer Assistance: with assist of gait belt to facilitate anterior translation from elevated bed pt able to lean forward and achieve full standing but maintained posterior lean throughout despite cueing and assist to correct posture.Pt able to pivot  with max assist for balance bed to chair and max assist to control descent as pt not flexing at hips and knees with sitting. Pt stood grossly 5 min with tech performing pericare due to incontinent bowels. Second stand from chair after multiple attempts with max assist able to get pt to shift weight forward and elevate sacrum but pt maintained fully flexed trunk for completion of pericare. Ambulation/Gait Ambulation/Gait Assistance: Not tested (comment)    Exercises     PT Diagnosis: Difficulty walking;Abnormality of gait  PT Problem List: Decreased strength;Decreased balance;Decreased mobility;Decreased activity tolerance;Decreased cognition;Decreased safety awareness;Decreased knowledge of use of DME PT Treatment Interventions: Gait training;DME instruction;Functional mobility training;Stair training;Balance training;Therapeutic activities;Therapeutic exercise;Cognitive remediation;Patient/family education   PT Goals Acute Rehab PT Goals PT Goal Formulation: With patient/family Time For Goal Achievement: 09/27/11 Potential to Achieve Goals: Fair Pt will go Supine/Side to Sit: with supervision PT Goal: Supine/Side to Sit - Progress: Goal set today Pt will go Sit to Supine/Side: with supervision PT Goal: Sit to Supine/Side - Progress: Goal set today Pt will go Sit to Stand: with mod assist PT Goal: Sit to Stand - Progress: Goal set today Pt will go Stand to Sit: with mod assist PT Goal: Stand to Sit - Progress: Goal set today Pt will Stand: 3 - 5 min;with bilateral upper extremity support;with min assist PT Goal: Stand - Progress: Goal set today Pt will Ambulate: 51 - 150 feet;with least restrictive assistive device;with mod assist PT Goal: Ambulate - Progress: Goal set today Pt will Go Up / Down Stairs: 1-2 stairs;with min assist;with least restrictive assistive device PT Goal: Up/Down Stairs - Progress: Goal set today Additional Goals Additional  Goal #1: Pt will demonstrate selective  attention during session in a moderately distracting environment  Visit Information  Last PT Received On: 09/13/11 Assistance Needed: +2    Subjective Data  Subjective: Tanya Patient Stated Goal: pt unable to state, family would like pt to return to PLOF   Prior Functioning  Home Living Lives With: Other (Comment) (was living with sister, separated from wife, plan to return ) Available Help at Discharge: Available PRN/intermittently Type of Home: House Home Access: Stairs to enter Entrance Stairs-Number of Steps: 2 Home Layout: Two level;Able to live on main level with bedroom/bathroom Bathroom Shower/Tub: Engineer, manufacturing systems: Standard Home Adaptive Equipment: None Prior Function Level of Independence: Independent Able to Take Stairs?: Yes Driving: Yes Communication Communication: No difficulties    Cognition  Overall Cognitive Status: Impaired Area of Impairment: Attention;Memory;Following commands;Safety/judgement Arousal/Alertness: Awake/alert Orientation Level: Disoriented to;Place;Time;Situation Behavior During Session: Flat affect Current Attention Level: Sustained Memory Deficits: Pt unable to recall wife's job, place, command Following Commands: Follows one step commands inconsistently Safety/Judgement: Decreased awareness of need for assistance Cognition - Other Comments: Pt following commands grossly 40% of session, unable to recognize LOB or correct with cueing    Extremity/Trunk Assessment Right Lower Extremity Assessment RLE ROM/Strength/Tone: [facility] for tasks assessed;Unable to fully assess;Due to impaired cognition Left Lower Extremity Assessment LLE ROM/Strength/Tone: [facility] for tasks assessed;Unable to fully assess;Due to impaired cognition (grossly 2+/5)   Balance Balance Balance Assessed: Yes Static Sitting Balance Static Sitting - Balance Support: Feet supported Static Sitting - Level of Assistance: 5: Stand by assistance Static Sitting  - Comment/# of Minutes: 6 Static Standing Balance Static Standing - Balance Support: Bilateral upper extremity supported Static Standing - Level of Assistance: 2: Max assist Static Standing - Comment/# of Minutes: 5 min with strong posterior lean  End of Session PT - End of Session Equipment Utilized During Treatment: Gait belt Activity Tolerance: Patient tolerated treatment well Patient left: in chair;with call bell/phone within reach;Other (comment);with family/visitor present (with sitter present) Nurse Communication: Mobility status   Delorse Lek 09/13/2011, 1:33 PM  Delaney Meigs, PT [phone]

## 2011-09-13 NOTE — Progress Notes (Signed)
Chart reviewed; plan functional study as outpatient once he recovers from acute illness. Alan Koch

## 2011-09-13 NOTE — Progress Notes (Signed)
Nutrition Follow-up  Patient was extubated 5/9.  TF has been off since 5/7.  Patient remains NPO due to marginal alertness.  Meds: Scheduled Meds:   . ampicillin-sulbactam (UNASYN) IV  3 g Intravenous Q12H  . antiseptic oral rinse  15 mL Mouth Rinse QID  . aspirin  81 mg Per Tube Daily  . benztropine mesylate  0.5 mg Intravenous Once  . chlorhexidine  15 mL Mouth Rinse BID  . furosemide  40 mg Intravenous Once  . furosemide  60 mg Intravenous Q12H  . haloperidol lactate      . haloperidol lactate  5 mg Intravenous Once  . heparin subcutaneous  5,000 Units Subcutaneous Q8H  . pantoprazole sodium  40 mg Per Tube Q1200  . potassium chloride  10 mEq Intravenous Q1 Hr x 4  . potassium chloride  10 mEq Intravenous Q1 Hr x 4  . SAILS Study - rosuvastatin / placebo (PI-Wright)  10 mg Oral Daily  . DISCONTD: acetaminophen (TYLENOL) oral liquid 160 mg/5 mL  975 mg Per Tube STAT  . DISCONTD: insulin aspart  0-15 Units Subcutaneous Q4H  . DISCONTD: risperiDONE  2 mg Per Tube BID   Continuous Infusions:  PRN Meds:.sodium chloride  Labs:  CMP     Component Value Date/Time   NA 148* 09/13/2011 0420   K 3.2* 09/13/2011 0420   CL 105 09/13/2011 0420   CO2 19 09/13/2011 0420   GLUCOSE 86 09/13/2011 0420   BUN 86* 09/13/2011 0420   CREATININE 4.60* 09/13/2011 0420   CALCIUM 8.6 09/13/2011 0420   PROT 6.0 09/11/2011 0630   ALBUMIN 2.0* 09/11/2011 0630   AST 25 09/12/2011 0450   ALT 27 09/12/2011 0450   ALKPHOS 297* 09/11/2011 0630   BILITOT 0.9 09/11/2011 0630   GFRNONAA 15* 09/13/2011 0420   GFRAA 17* 09/13/2011 0420   CBG (last 3)   Basename 09/13/11 1129 09/13/11 0801 09/13/11 0402  GLUCAP 94 90 84     Intake/Output Summary (Last 24 hours) at 09/13/11 1311 Last data filed at 09/13/11 1300  Gross per 24 hour  Intake 866.83 ml  Output   2450 ml  Net -1583.17 ml    Weight Status:  89.3 kg (196 lb) down from 219 lb 5/6 with negative fluid balance  Re-estimated needs:  2200-2400 kcals, 110-125  grams protein, 2.2-2.4 liters fluid daily  Nutrition Dx:  Inadequate oral intake, ongoing.  Goal:  1. While intubated, intake to meet 60-70% of estimated calorie needs (22-25 kcals/kg ideal body weight) and 100% of estimated protein needs, based on ASPEN guidelines for permissive underfeeding in critically ill obese individuals.--no longer applicable. 2. Positive tolerance of TF at goal rate.--no longer applicable  New Goal:  Intake to meet 90-100% of estimated nutrition needs.   Intervention:  If unable to safely advance PO diet, recommend resume TF via NG tube with Jevity 1.2 at 20 ml/h, increase by 10 ml every 4 hours to goal rate of 80 ml/h with Prostat 30 ml once daily to provide 2404 kcals, 122 grams protein, 1555 ml free water daily.  Monitor:  PO diet advancement, intake, labs, weight trend.   Hettie Holstein Pager #:  [phone]

## 2011-09-14 DIAGNOSIS — R1310 Dysphagia, unspecified: Secondary | ICD-10-CM

## 2011-09-14 DIAGNOSIS — N179 Acute kidney failure, unspecified: Secondary | ICD-10-CM

## 2011-09-14 DIAGNOSIS — R197 Diarrhea, unspecified: Secondary | ICD-10-CM

## 2011-09-14 DIAGNOSIS — J690 Pneumonitis due to inhalation of food and vomit: Secondary | ICD-10-CM

## 2011-09-14 LAB — MAGNESIUM: Magnesium: 2.9 mg/dL — ABNORMAL HIGH (ref 1.5–2.5)

## 2011-09-14 LAB — URINALYSIS, ROUTINE W REFLEX MICROSCOPIC
Leukocytes, UA: NEGATIVE
Nitrite: NEGATIVE
Specific Gravity, Urine: 1.019 (ref 1.005–1.030)
pH: 5.5 (ref 5.0–8.0)

## 2011-09-14 LAB — PHOSPHORUS: Phosphorus: 6.9 mg/dL — ABNORMAL HIGH (ref 2.3–4.6)

## 2011-09-14 LAB — GLUCOSE, CAPILLARY
Glucose-Capillary: 110 mg/dL — ABNORMAL HIGH (ref 70–99)
Glucose-Capillary: 94 mg/dL (ref 70–99)

## 2011-09-14 LAB — BASIC METABOLIC PANEL
CO2: 20 mEq/L (ref 19–32)
Calcium: 8.9 mg/dL (ref 8.4–10.5)
GFR calc Af Amer: 18 mL/min — ABNORMAL LOW (ref >90)
GFR calc non Af Amer: 16 mL/min — ABNORMAL LOW (ref >90)
Sodium: 151 mEq/L — ABNORMAL HIGH (ref 135–145)

## 2011-09-14 LAB — CBC
MCH: 28.2 pg (ref 26.0–34.0)
Platelets: 513 10*3/uL — ABNORMAL HIGH (ref 150–400)
RBC: 3.09 MIL/uL — ABNORMAL LOW (ref 4.22–5.81)

## 2011-09-14 MED ORDER — SODIUM CHLORIDE 0.9 % IV SOLN
0.2000 ug/kg/h | INTRAVENOUS | Status: DC
  Administered 2011-09-14: 0.4 ug/kg/h via INTRAVENOUS
  Filled 2011-09-14 (×3): qty 2

## 2011-09-14 MED ORDER — BIOTENE DRY MOUTH MT LIQD
15.0000 mL | Freq: Two times a day (BID) | OROMUCOSAL | Status: DC
  Administered 2011-09-14 – 2011-09-19 (×7): 15 mL via OROMUCOSAL

## 2011-09-14 MED ORDER — METOPROLOL TARTRATE 1 MG/ML IV SOLN
10.0000 mg | Freq: Four times a day (QID) | INTRAVENOUS | Status: DC
  Administered 2011-09-14 – 2011-09-15 (×3): 10 mg via INTRAVENOUS
  Filled 2011-09-14 (×6): qty 10

## 2011-09-14 NOTE — Progress Notes (Signed)
Speech Language Pathology Dysphagia Treatment Patient Details Name: Alan Koch MRN: [id number] DOB: August 29, 1970 Today's Date: 09/14/2011 Time: 9:00 am to 9:30    Assessment / Plan / Recommendation Clinical Impression  Patient seen to reassess swallow for possible PO readiness following initial BSE completed on 09/13/11. Improvement noted in areas in LOA and ability to complete volitional swallow and cough. Patient presented with wet vocal quality but cleared immediately s/p completion of oral care with oral suction. Moderate amount of  Thick, blood -tinged mucous  cleared from posterior pharyngeal wall.  RN made aware.     Patient continues to exhibit outward +s/s of aspiration s/p all PO trials with weak, ineffective cough. Puree trials eventually cleared out of oral cavity secondary to poor bolus formation and decreased anterior to posterior transit.  Patient noted to be "mouth breather" with increase in RR during PO trials.  Airway protection with PO intake continues to be poor with increased risk for aspiration.  Recommend to follow on 09/15/11 for possible PO readiness and completion of objective evaluation of MBS.      Diet Recommendation  Continue with Current Diet: NPO    SLP Plan Continue with current plan of care      Swallowing Goals  SLP Swallowing Goals Swallow Study Goal #3 - Progress: Progressing toward goal  General Temperature Spikes Noted: No Respiratory Status: Supplemental O2 delivered via (comment) Behavior/Cognition: Impulsive;Distractible;Requires cueing;Confused Patient Positioning: Upright in bed  Oral Cavity - Oral Hygiene Does patient have any of the following "at risk" factors?: Nutritional status - inadequate;Oxygen therapy - cannula, mask, simple oxygen devices Patient is HIGH RISK - Oral Care Protocol followed (see row info): Yes   Dysphagia Treatment Treatment focused on: Patient/family/caregiver education;Facilitation of oral preparatory  phase;Facilitation of oral phase;Facilitation of pharyngeal phase Family/Caregiver Educated: Patient's mother Treatment Methods/Modalities: Skilled observation;Differential diagnosis Patient observed directly with PO's: Yes Type of PO's observed: Dysphagia 1 (puree);Thin liquids;Ice chips Feeding: Total assist Liquids provided via: Teaspoon Oral Phase Signs & Symptoms: Prolonged oral phase Pharyngeal Phase Signs & Symptoms: Suspected delayed swallow initiation;Changes in respirations Type of cueing: Verbal;Tactile;Visual Amount of cueing: Maximal Moreen Fowler MS, CCC-SLP [phone]   [facility] 09/14/2011, 11:00 AM
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
## 2011-09-14 NOTE — Progress Notes (Signed)
Speech Language Pathology Treatment Patient Details Name: Alan Koch MRN: [id number] DOB: [birth year] Today's Date: 09/14/2011 Time: 8295-6213 SLP Time Calculation (min): 15 min  Assessment / Plan / Recommendation Clinical Impression: Patient continues to present with severe cognitive deficits and requires continued skilled ST treatment in acute care setting.      SLP Plan  Continue with current plan of care       SLP Goals  SLP Goals Potential to Achieve Goals: Good Progress/Goals/Alternative treatment plan discussed with pt/caregiver and they: Agree SLP Goal #1 - Progress: Progressing toward goal SLP Goal #2 - Progress: Progressing toward goal SLP Goal #3 - Progress: Progressing toward goal  General Temperature Spikes Noted: No Respiratory Status: Supplemental O2 delivered via (comment) Behavior/Cognition: Confused;Cooperative;Distractible;Requires cueing;Decreased sustained attention Patient Positioning: Upright in bed  Oral Cavity - Oral Hygiene Does patient have any of the following "at risk" factors?: Nutritional status - inadequate;Oxygen therapy - cannula, mask, simple oxygen devices Patient is HIGH RISK - Oral Care Protocol followed (see row info): Yes   Treatment Treatment focused on: Cognition Skilled Treatment: Patient seen for cognitive treatment following initial SLE completed on 09/13/11.  Treatment focused on improving patient's ability to follow one-step verbal commands.  Repetition of simple one-step  directions paired  with increased response time effective. 5/5 directions completed correctly with max verbal and tactile cues required. Improvement noted in area of  communication as patient able to answer personal information questions.  Moreen Fowler MS, CCC-SLP [phone] [facility] 09/14/2011, 11:27 AM
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
## 2011-09-15 DIAGNOSIS — R197 Diarrhea, unspecified: Secondary | ICD-10-CM

## 2011-09-15 DIAGNOSIS — R1310 Dysphagia, unspecified: Secondary | ICD-10-CM

## 2011-09-15 DIAGNOSIS — J690 Pneumonitis due to inhalation of food and vomit: Secondary | ICD-10-CM

## 2011-09-15 DIAGNOSIS — N179 Acute kidney failure, unspecified: Secondary | ICD-10-CM

## 2011-09-15 LAB — BASIC METABOLIC PANEL
BUN: 96 mg/dL — ABNORMAL HIGH (ref 6–23)
Calcium: 9.1 mg/dL (ref 8.4–10.5)
GFR calc Af Amer: 22 mL/min — ABNORMAL LOW (ref >90)
GFR calc non Af Amer: 19 mL/min — ABNORMAL LOW (ref >90)
Potassium: 3.8 mEq/L (ref 3.5–5.1)
Sodium: 158 mEq/L — ABNORMAL HIGH (ref 135–145)

## 2011-09-15 LAB — CBC
MCHC: 32.1 g/dL (ref 30.0–36.0)
Platelets: 546 10*3/uL — ABNORMAL HIGH (ref 150–400)
RDW: 16.9 % — ABNORMAL HIGH (ref 11.5–15.5)
WBC: 17.2 10*3/uL — ABNORMAL HIGH (ref 4.0–10.5)

## 2011-09-15 LAB — GLUCOSE, CAPILLARY: Glucose-Capillary: 96 mg/dL (ref 70–99)

## 2011-09-15 LAB — TRIGLYCERIDES: Triglycerides: 424 mg/dL — ABNORMAL HIGH (ref <150)

## 2011-09-15 MED ORDER — STUDY - INVESTIGATIONAL DRUG SIMPLE RECORD
40.0000 mg | Freq: Once | Status: AC
  Administered 2011-09-15: 40 mg via ORAL
  Filled 2011-09-15: qty 40

## 2011-09-15 MED ORDER — STUDY - INVESTIGATIONAL DRUG SIMPLE RECORD
10.0000 mg | Freq: Every day | Status: DC
  Filled 2011-09-15: qty 10

## 2011-09-15 MED ORDER — METOPROLOL TARTRATE 25 MG PO TABS
25.0000 mg | ORAL_TABLET | Freq: Two times a day (BID) | ORAL | Status: DC
  Administered 2011-09-15 – 2011-09-19 (×8): 25 mg via ORAL
  Filled 2011-09-15 (×9): qty 1

## 2011-09-15 MED ORDER — DEXTROSE 5 % IV BOLUS
500.0000 mL | Freq: Once | INTRAVENOUS | Status: AC
  Administered 2011-09-15: 500 mL via INTRAVENOUS

## 2011-09-15 MED ORDER — SODIUM CHLORIDE 0.9 % IV SOLN
INTRAVENOUS | Status: DC
  Administered 2011-09-15: 10:00:00 via INTRAVENOUS

## 2011-09-15 MED ORDER — DEXTROSE 5 % IV SOLN
INTRAVENOUS | Status: DC
  Administered 2011-09-15 – 2011-09-17 (×4): via INTRAVENOUS
  Administered 2011-09-17: 75 mL via INTRAVENOUS
  Administered 2011-09-18 – 2011-09-19 (×4): via INTRAVENOUS
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
## 2011-09-15 NOTE — Progress Notes (Signed)
Speech Language Pathology Dysphagia Treatment Patient Details Name: RAYN ENDERSON MRN: [id number] DOB: 10/30/1970 Today's Date: 09/15/2011 Time: 1030-1100 SLP Time Calculation (min): 30 min  Assessment / Plan / Recommendation Clinical Impression  Purpose of treatment to reevaluate swallow for PO readiness. Oropharyngeal swallow functional with no observed s/s of aspiration noted with PO trials. Initiate regular diet consistency with thin liquids. Continue full supervision with all meals due to continued decreased safety awareness.     Diet Recommendation  Initiate / Change Diet: Regular;Thin liquid    SLP Plan Goals updated      Swallowing Goals  SLP Swallowing Goals Patient will consume recommended diet without observed clinical signs of aspiration with: Supervision/safety Patient will utilize recommended strategies during swallow to increase swallowing safety with: Supervision/safety Swallow Study Goal #3 - Progress: Met  General Temperature Spikes Noted: No Respiratory Status: Room air Behavior/Cognition: Alert;Agitated;Impulsive;Distractible;Requires cueing Oral Cavity - Dentition: Adequate natural dentition Patient Positioning: Upright in chair  Oral Cavity - Oral Hygiene Brush patient's teeth BID with toothbrush (using toothpaste with fluoride): Yes   Dysphagia Treatment Treatment focused on: Facilitation of oral preparatory phase;Patient/family/caregiver education;Facilitation of oral phase;Facilitation of pharyngeal phase Family/Caregiver Educated: Spouse  Treatment Methods/Modalities: Skilled observation Patient observed directly with PO's: Yes Type of PO's observed: Regular;Dysphagia 1 (puree);Thin liquids;Ice chips Feeding: Able to feed self Liquids provided via: Teaspoon;Cup;Straw Type of cueing: Verbal Amount of cueing: Moderate   Moreen Fowler MS, CCC-SLP [phone]  [facility] 09/15/2011, 2:46 PM
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
## 2011-09-16 DIAGNOSIS — J690 Pneumonitis due to inhalation of food and vomit: Secondary | ICD-10-CM

## 2011-09-16 DIAGNOSIS — R197 Diarrhea, unspecified: Secondary | ICD-10-CM

## 2011-09-16 DIAGNOSIS — R1310 Dysphagia, unspecified: Secondary | ICD-10-CM

## 2011-09-16 DIAGNOSIS — N179 Acute kidney failure, unspecified: Secondary | ICD-10-CM

## 2011-09-16 LAB — BASIC METABOLIC PANEL
CO2: 20 mEq/L (ref 19–32)
Calcium: 9.1 mg/dL (ref 8.4–10.5)
Chloride: 115 mEq/L — ABNORMAL HIGH (ref 96–112)
Creatinine, Ser: 3.05 mg/dL — ABNORMAL HIGH (ref 0.50–1.35)
Glucose, Bld: 131 mg/dL — ABNORMAL HIGH (ref 70–99)

## 2011-09-16 LAB — CBC
HCT: 30.9 % — ABNORMAL LOW (ref 39.0–52.0)
Hemoglobin: 10 g/dL — ABNORMAL LOW (ref 13.0–17.0)
MCH: 28.1 pg (ref 26.0–34.0)
MCV: 86.8 fL (ref 78.0–100.0)
Platelets: 617 10*3/uL — ABNORMAL HIGH (ref 150–400)
RBC: 3.56 MIL/uL — ABNORMAL LOW (ref 4.22–5.81)
WBC: 17 10*3/uL — ABNORMAL HIGH (ref 4.0–10.5)

## 2011-09-16 LAB — GLUCOSE, CAPILLARY
Glucose-Capillary: 105 mg/dL — ABNORMAL HIGH (ref 70–99)
Glucose-Capillary: 95 mg/dL (ref 70–99)

## 2011-09-16 LAB — AMMONIA: Ammonia: 39 umol/L (ref 11–60)

## 2011-09-16 LAB — VITAMIN B12: Vitamin B-12: 1172 pg/mL — ABNORMAL HIGH (ref 211–911)

## 2011-09-16 MED ORDER — STUDY - INVESTIGATIONAL DRUG SIMPLE RECORD
10.0000 mg | Freq: Every day | Status: AC
  Administered 2011-09-16 – 2011-09-17 (×2): 10 mg via ORAL
  Filled 2011-09-16 (×3): qty 10

## 2011-09-16 MED ORDER — OLANZAPINE 5 MG PO TABS
5.0000 mg | ORAL_TABLET | Freq: Every day | ORAL | Status: DC
  Administered 2011-09-16 – 2011-09-18 (×3): 5 mg via ORAL
  Filled 2011-09-16 (×4): qty 1

## 2011-09-16 NOTE — Care Management Note (Addendum)
    Page 1 of 2   09/19/2011     3:03:36 PM   CARE MANAGEMENT NOTE 09/19/2011  Patient:  Alan Koch, Alan Koch   Account Number:  [id number]  Date Initiated:  09/09/2011  Documentation initiated by:  [facility]  Subjective/Objective Assessment:   Found down - aspiration - intubated.  Lives Alone.     Action/Plan:   Anticipated DC Date:  09/19/2011   Anticipated DC Plan:  HOME W HOME HEALTH SERVICES  In-house referral  Clinical Social Worker      DC Planning Services  CM consult      Choice offered to / List presented to:             Status of service:  Completed, signed off Medicare Important Message given?   (If response is "NO", the following Medicare IM given date fields will be blank) Date Medicare IM given:   Date Additional Medicare IM given:    Discharge Disposition:  HOME/SELF CARE  Per UR Regulation:  Reviewed for med. necessity/level of care/duration of stay  If discussed at Long Length of Stay Meetings, dates discussed:   09/18/2011    Comments:  09/19/11 15:01 Letha Cape RN, BSN [phone] patient for dc today, physical therapy recs outpt pt, faxed script to [facility] outpt  [facility] therapy, spoke with representative there ,pt has appt for 5/20 at 1 pm for speech and pt.  Patient and wife will follow up on outpt drug rehab information that CSW left them.  09/18/11 16:02 Letha Cape RN, BSN [phone] patient is not a candidate for CIR per Britta Mccreedy, patient will need drug rehab, CSW following.  Patient lives alone, has medication coverage and his wife will be his transportation when he is discharged.  5/14 10am debbie dowell rn,bsn during conf w md's this am they do not feel mental may clear much more. for poss snf vs cir vs hhc w fam.sw ref made.  09/16/11 11am debbie dowell rn,bsn [phone] pt being seen by psych for substance abuse. phy ther rec cir. will leave sticky note about phy ther rec.

## 2011-09-16 NOTE — Progress Notes (Signed)
At  0600 [name] fell out of the bed on the floor. Sitter was in the room with pt.[name] stated that she was looking down when she heard the rail fall down then pt. Fell on the floor, landed on his bottom then rolled to his knees. She stated the patient never hit his head. Lenny Pastel NP was notified and family is being called.
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
## 2011-09-16 NOTE — Progress Notes (Signed)
Speech Language Pathology Treatment Patient Details Name: Alan Koch MRN: [id number] DOB: [birth year] Today's Date: 09/16/2011 Time: 0810-0840 SLP Time Calculation (min): 30 min  Assessment / Plan / Recommendation Clinical Impression  Pt continues to demonstrate subtle signs of penetration with PO intake including intermittent wet vocal quality, 2-3 throat clears/cough with two cups of juice/water. Suspect oral residue falling to pharynx post swallow due to attention impairment though intake become more timely and functional. Will allow pt to continue current diet with full supervison for aspiration precautions. Will consider objective testing if concern for pna/over aspraiton continues.  Pt also treated for cognition; showing great improvements though he continues to confabulate and be impulsive, requires moderate verbal cues for basic functional problem solving and safety awareness.     SLP Plan  Goals updated    Pertinent Vitals/Pain NA  SLP Goals  SLP Goals Potential to Achieve Goals: Good Progress/Goals/Alternative treatment plan discussed with pt/caregiver and they: Agree SLP Goal #1: Pt will attend to basic functional task through completion with moderate contextual cues.  SLP Goal #1 - Progress: Progressing toward goal SLP Goal #2: Pt will complete basic problem solving during functional task with moderate verbal cues.  SLP Goal #2 - Progress: Progressing toward goal SLP Goal #3: Pt will demosntrate emergent awareness of deficits with moderate multimodal cues.  SLP Goal #3 - Progress: Progressing toward goal Pt will tolerate regular diet and thin liquids with mod assist for precautions and no overt s/s of aspriation Progressing.  General Temperature Spikes Noted: Yes Respiratory Status: Room air Behavior/Cognition: Alert;Agitated;Impulsive;Distractible;Requires cueing Oral Cavity - Dentition: Adequate natural dentition Patient Positioning: Upright in chair  Oral Cavity -  Oral Hygiene Does patient have any of the following "at risk" factors?: Nutritional status - inadequate Brush patient's teeth BID with toothbrush (using toothpaste with fluoride): Yes Patient is HIGH RISK - Oral Care Protocol followed (see row info): Yes Patient is AT RISK - Oral Care Protocol followed (see row info): Yes Patient is mechanically ventilated, follow VAP prevention protocol for oral care: Oral care provided every 4 hours   Treatment Treatment focused on: Cognition Skilled Treatment: Moderate verbal cues, choice cues, repetition, question cues provided for attention, orientation, memory, safety awareness and problem solving during functional task, meal.    Alan Koch, [name] 09/16/2011, 10:09 AM

## 2011-09-17 ENCOUNTER — Inpatient Hospital Stay (HOSPITAL_COMMUNITY): Payer: BC Managed Care – PPO

## 2011-09-17 DIAGNOSIS — F19921 Other psychoactive substance use, unspecified with intoxication with delirium: Secondary | ICD-10-CM

## 2011-09-17 DIAGNOSIS — J690 Pneumonitis due to inhalation of food and vomit: Secondary | ICD-10-CM

## 2011-09-17 DIAGNOSIS — R197 Diarrhea, unspecified: Secondary | ICD-10-CM

## 2011-09-17 DIAGNOSIS — N179 Acute kidney failure, unspecified: Secondary | ICD-10-CM

## 2011-09-17 LAB — CBC
HCT: 23.9 % — ABNORMAL LOW (ref 39.0–52.0)
Hemoglobin: 7.9 g/dL — ABNORMAL LOW (ref 13.0–17.0)
MCH: 27.9 pg (ref 26.0–34.0)
MCHC: 33.1 g/dL (ref 30.0–36.0)
MCV: 84.5 fL (ref 78.0–100.0)
RBC: 2.83 MIL/uL — ABNORMAL LOW (ref 4.22–5.81)

## 2011-09-17 LAB — BASIC METABOLIC PANEL
BUN: 64 mg/dL — ABNORMAL HIGH (ref 6–23)
CO2: 22 mEq/L (ref 19–32)
Calcium: 8.3 mg/dL — ABNORMAL LOW (ref 8.4–10.5)
Creatinine, Ser: 2.86 mg/dL — ABNORMAL HIGH (ref 0.50–1.35)
Glucose, Bld: 114 mg/dL — ABNORMAL HIGH (ref 70–99)

## 2011-09-17 LAB — TSH: TSH: 2.886 u[IU]/mL (ref 0.350–4.500)

## 2011-09-17 NOTE — Progress Notes (Signed)
Pt transferred to unit 5500 from unit 2900 on 09/17/11.  Report received from [place], [place].  Pt is alert and oriented to person, place, and time, but disoriented to situation.  Safety sitter at the bedside.  Skin is intact with no evidence of breakdown.

## 2011-09-17 NOTE — Progress Notes (Signed)
Physical Therapy Treatment Patient Details Name: Alan Koch MRN: [id number] DOB: [birth year] Today's Date: 09/17/2011 Time: 1355-1406 PT Time Calculation (min): 11 min  PT Assessment / Plan / Recommendation Comments on Treatment Session  Pt adm for resp failure and substance abuse.  Pt making good progress with mobility.    Follow Up Recommendations  Inpatient Rehab    Barriers to Discharge        Equipment Recommendations  Defer to next venue    Recommendations for Other Services    Frequency Min 3X/week   Plan Discharge plan remains appropriate;Frequency remains appropriate    Precautions / Restrictions Precautions Precautions: Fall   Pertinent Vitals/Pain N/A    Mobility  Bed Mobility Bed Mobility: Sit to Supine Supine to Sit: 4: Min assist;HOB flat Sitting - Scoot to Edge of Bed: 4: Min assist Sit to Supine: 5: Supervision;HOB flat Details for Bed Mobility Assistance: Cuing to stay on task. Transfers Sit to Stand: 4: Min assist;With upper extremity assist;From bed (used hands on walker despite cues.) Stand to Sit: 4: Min assist;With upper extremity assist;To bed Ambulation/Gait Ambulation/Gait Assistance: 4: Min assist (+1 for IV) Ambulation Distance (Feet): 100 Feet Assistive device: Rolling walker Gait Pattern: Wide base of support;Right flexed knee in stance;Left flexed knee in stance    Exercises     PT Diagnosis:    PT Problem List:   PT Treatment Interventions:     PT Goals Acute Rehab PT Goals Pt will go Supine/Side to Sit: with modified independence PT Goal: Supine/Side to Sit - Progress: Updated due to goal met Pt will go Sit to Supine/Side: with modified independence PT Goal: Sit to Supine/Side - Progress: Updated due to goal met Pt will go Sit to Stand: with modified independence PT Goal: Sit to Stand - Progress: Updated due to goal met Pt will go Stand to Sit: with modified independence PT Goal: Stand to Sit - Progress: Updated due to  goals met Pt will Stand: with modified independence;1 - 2 min PT Goal: Stand - Progress: Updated due to goal met Pt will Ambulate: >150 feet;with supervision PT Goal: Ambulate - Progress: Updated due to goal met Additional Goals PT Goal: Additional Goal #1 - Progress: Progressing toward goal  Visit Information  Last PT Received On: 09/17/11 Assistance Needed: +1    Subjective Data      Cognition  Overall Cognitive Status: Impaired Arousal/Alertness: Awake/alert Current Attention Level: Sustained Following Commands: Follows one step commands with increased time Safety/Judgement: Decreased awareness of need for assistance;Impulsive    Balance  Static Sitting Balance Static Sitting - Balance Support: No upper extremity supported Static Sitting - Level of Assistance: 5: Stand by assistance Static Sitting - Comment/# of Minutes: 3 minutes Static Standing Balance Static Standing - Balance Support: No upper extremity supported Static Standing - Level of Assistance: 4: Min assist  End of Session PT - End of Session Equipment Utilized During Treatment: Gait belt Activity Tolerance: Patient tolerated treatment well Patient left: in bed;with call bell/phone within reach;with bed alarm set;with family/visitor present;Other (comment) (sitter present) Nurse Communication: Mobility status    Valerye Kobus 09/17/2011, 2:30 PM  [facility] PT [phone]

## 2011-09-17 NOTE — Progress Notes (Signed)
Speech Language Pathology Dysphagia Treatment Patient Details Name: Alan Koch MRN: [id number] DOB: 12/22/1970 Today's Date: 09/17/2011 Time: 4098-1191 SLP Time Calculation (min): 25 min  Assessment / Plan / Recommendation Clinical Impression  Pt does not demonstrate any signs of penetration of thin liquids even with large continuous sips. Pt is more alert and appropriate today. Able to verbalize orientation x4 with max verbal cues initially then recalling information independently. Pt participating in basic verbal reasoning tasks with mod verbal cues. Pt ceased confabulation with min verbal cues.     Diet Recommendation       SLP Plan Continue with current plan of care   Pertinent Vitals/Pain NA   Swallowing Goals  SLP Swallowing Goals Patient will consume recommended diet without observed clinical signs of aspiration with: Supervision/safety Swallow Study Goal #1 - Progress: Progressing toward goal Patient will utilize recommended strategies during swallow to increase swallowing safety with: Supervision/safety Swallow Study Goal #2 - Progress: Progressing toward goal  See Doc flow sheets for updated cognitive goals.   General Temperature Spikes Noted: Yes Respiratory Status: Room air Behavior/Cognition: Alert;Cooperative;Impulsive;Requires cueing Oral Cavity - Dentition: Adequate natural dentition Patient Positioning: Upright in chair   Dysphagia Treatment Feeding: Needs set up   [name], [name] 09/17/2011, 10:46 AM
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
## 2011-09-18 DIAGNOSIS — R197 Diarrhea, unspecified: Secondary | ICD-10-CM

## 2011-09-18 DIAGNOSIS — M25511 Pain in right shoulder: Secondary | ICD-10-CM | POA: Diagnosis present

## 2011-09-18 DIAGNOSIS — F19921 Other psychoactive substance use, unspecified with intoxication with delirium: Secondary | ICD-10-CM

## 2011-09-18 DIAGNOSIS — G92 Toxic encephalopathy: Secondary | ICD-10-CM

## 2011-09-18 DIAGNOSIS — G8929 Other chronic pain: Secondary | ICD-10-CM | POA: Diagnosis present

## 2011-09-18 DIAGNOSIS — N179 Acute kidney failure, unspecified: Secondary | ICD-10-CM

## 2011-09-18 DIAGNOSIS — R5381 Other malaise: Secondary | ICD-10-CM

## 2011-09-18 DIAGNOSIS — G929 Unspecified toxic encephalopathy: Secondary | ICD-10-CM

## 2011-09-18 DIAGNOSIS — J690 Pneumonitis due to inhalation of food and vomit: Secondary | ICD-10-CM

## 2011-09-18 LAB — IRON AND TIBC
Saturation Ratios: 27 % (ref 20–55)
TIBC: 231 ug/dL (ref 215–435)
UIBC: 169 ug/dL (ref 125–400)

## 2011-09-18 LAB — CBC
HCT: 22.1 % — ABNORMAL LOW (ref 39.0–52.0)
Hemoglobin: 7.5 g/dL — ABNORMAL LOW (ref 13.0–17.0)
MCH: 28.3 pg (ref 26.0–34.0)
MCHC: 33.9 g/dL (ref 30.0–36.0)

## 2011-09-18 LAB — FOLATE: Folate: 2.1 ng/mL — ABNORMAL LOW

## 2011-09-18 LAB — BASIC METABOLIC PANEL
BUN: 48 mg/dL — ABNORMAL HIGH (ref 6–23)
Chloride: 109 mEq/L (ref 96–112)
Creatinine, Ser: 2.66 mg/dL — ABNORMAL HIGH (ref 0.50–1.35)
GFR calc Af Amer: 33 mL/min — ABNORMAL LOW (ref >90)
Glucose, Bld: 105 mg/dL — ABNORMAL HIGH (ref 70–99)

## 2011-09-18 LAB — CULTURE, BLOOD (ROUTINE X 2): Culture  Setup Time: 201305130222

## 2011-09-18 LAB — RETICULOCYTES: RBC.: 2.65 MIL/uL — ABNORMAL LOW (ref 4.22–5.81)

## 2011-09-18 MED ORDER — POTASSIUM CHLORIDE CRYS ER 20 MEQ PO TBCR
60.0000 meq | EXTENDED_RELEASE_TABLET | Freq: Four times a day (QID) | ORAL | Status: AC
  Administered 2011-09-18 (×2): 60 meq via ORAL
  Filled 2011-09-18 (×2): qty 3

## 2011-09-18 MED ORDER — ACETAMINOPHEN 325 MG PO TABS
650.0000 mg | ORAL_TABLET | Freq: Four times a day (QID) | ORAL | Status: DC | PRN
  Administered 2011-09-18 – 2011-09-19 (×2): 650 mg via ORAL
  Filled 2011-09-18 (×2): qty 2

## 2011-09-18 MED ORDER — PANTOPRAZOLE SODIUM 40 MG PO TBEC
40.0000 mg | DELAYED_RELEASE_TABLET | Freq: Every day | ORAL | Status: DC
  Administered 2011-09-18 – 2011-09-19 (×2): 40 mg via ORAL
  Filled 2011-09-18 (×2): qty 1

## 2011-09-18 NOTE — Progress Notes (Signed)
Safety sitter D/Ced per Dr. Arthor Captain.  Pt moved into a camera room close to nurse's station and placed on bed alarm.  Fall precautions in place.  Will monitor pt closely.

## 2011-09-18 NOTE — Clinical Social Work Psychosocial (Signed)
     Clinical Social Work Department BRIEF PSYCHOSOCIAL ASSESSMENT 09/18/2011  Patient:  Alan Koch, Alan Koch     Account Number:  [id number]     Admit date:  09/06/2011  Clinical Social Worker:  Jacelyn Grip  Date/Time:  09/18/2011 03:00 PM  Referred by:  Physician  Date Referred:  09/18/2011 Referred for  Other - See comment  Substance Abuse   Other Referral:   other psychosocial needs   Interview type:  Patient Other interview type:    PSYCHOSOCIAL DATA Living Status:  WIFE Admitted from facility:   Level of care:   Primary support name:  Alan Koch/spouse/[phone] Primary support relationship to patient:  SPOUSE Degree of support available:   strong    CURRENT CONCERNS Current Concerns  Other - See comment  Substance Abuse   Other Concerns:    SOCIAL WORK ASSESSMENT / PLAN Pt transferred to Unit 5500 the afternoon of 5/14. Pt is being followed by psych and per notes appears to be mostly for medication management.    CSW met with pt at bedside to discuss pt other psychosocial needs and pt current substance abuse. Pt mother and pt sitter present at this time. Pt discussed that he feels much improved since yesterday. CSW discussed that initially yesterday the recommendation was for Inpatient rehab, but given pt improvements PT now recommending Outpatient Neuro Rehab with 24 hour supervision. CSW discussed with pt current substance use and options for outpatient and inpatient treatment. Pt stated that he would be agreeable to treatment. Pt discussed that his employer refers to an inpatient rehab in [place], [place]. CSW discussed other options for inpatient and outpatient substance abuse treatment options and pt agreeable to resources. Pt would like to discuss with pt wife and decide after discharge which facility he would like to seek. Pt provided permission for CSW to contact pt wife. CSW discussed with pt wife and stated that CSW will compile resources to provide to pt  and pt wife to follow up with once pt is discharged. Pt wife expressed understanding. CSW to compile resources for pt and provide pt and pt wife substance abuse treatment resources.   Assessment/plan status:  Information/Referral to [facility] Other assessment/ plan:   Information/referral to community resources:   Will follow up to provide Substance Abuse Treatment options for Outpatient and Inpatient Treatment.    PATIENTS/FAMILYS RESPONSE TO PLAN OF CARE: Pt alert and oriented. Pt quiet during interview but answered this CSW questions. Pt agreeable to resources and states that he plans to discuss with spouse and follow up after discharge.

## 2011-09-18 NOTE — Progress Notes (Addendum)
Physical Therapy Treatment Patient Details Name: Alan Koch MRN: [id number] DOB: 10-25-1970 Today's Date: 09/18/2011 Time: 4782-9562 PT Time Calculation (min): 23 min  PT Assessment / Plan / Recommendation Comments on Treatment Session  Mr. Ashworth has made great progress. Since CIR does not feel he is appropriate I would rather pt go to outpatient physical therapy given his age and good progress recently. He will definitely need 24 hour supervision initially going home. Will work with him again tomorrow morning. Needs to use RW until cleared by PT.     Follow Up Recommendations  Outpatient Neuro PT;Supervision/Assistance - 24 hour    Barriers to Discharge        Equipment Recommendations  Rolling walker with 5" wheels    Recommendations for Other Services    Frequency Min 5X/week   Plan Discharge plan needs to be updated;Frequency needs to be updated    Precautions / Restrictions Precautions Precautions: Fall       Mobility  Bed Mobility Supine to Sit: 5: Supervision;HOB flat Sitting - Scoot to Edge of Bed: 5: Supervision Transfers Sit to Stand: 4: Min guard;From bed;With upper extremity assist Stand to Sit: To bed;With upper extremity assist;4: Min guard Details for Transfer Assistance: cues for safe technique, immediately upon standing pt appears unsteady but able to correct with no physical assist Ambulation/Gait Ambulation/Gait Assistance: 4: Min guard;4: Min assist Ambulation Distance (Feet): 400 Feet Assistive device: None Ambulation/Gait Assistance Details: ambulates with very floppy/hypotonic type gait; staggers and scissors needing minA to correct, easily fatigued, tends to bump into walls more on the right than the left and doesn't correct it Gait Pattern: Scissoring Stairs: Yes Stairs Assistance: 4: Min assist Stairs Assistance Details (indicate cue type and reason): minA to stabilize on stairs for safe technique Stair Management Technique: One rail  Right;Step to pattern;Forwards Number of Stairs: 3     Exercises General Exercises - Lower Extremity Hip ABduction/ADduction: AROM;Both;10 reps;Standing Other Exercises Other Exercises: standing hip extension x10 supported at the sink     PT Goals Acute Rehab PT Goals PT Goal: Supine/Side to Sit - Progress: Progressing toward goal PT Goal: Sit to Supine/Side - Progress: Progressing toward goal PT Goal: Sit to Stand - Progress: Progressing toward goal PT Goal: Stand to Sit - Progress: Progressing toward goal PT Goal: Stand - Progress: Progressing toward goal PT Goal: Ambulate - Progress: Progressing toward goal Pt will Go Up / Down Stairs: 3-5 stairs;with modified independence;with rail(s) PT Goal: Up/Down Stairs - Progress: Updated due to goal met Additional Goals PT Goal: Additional Goal #1 - Progress: Met  Visit Information  Last PT Received On: 09/18/11 Assistance Needed: +1    Subjective Data  Subjective: Im tired. Patient Stated Goal: pt would like to go home   Cognition  Overall Cognitive Status: Impaired Area of Impairment: Awareness of errors Arousal/Alertness: Awake/alert Orientation Level: Appears intact for tasks assessed Behavior During Session: Flat affect Current Attention Level: Selective Following Commands: Follows multi-step commands consistently Safety/Judgement: Decreased awareness of need for assistance Awareness of Errors: Assistance required to identify errors made;Assistance required to correct errors made Awareness of Errors - Other Comments: pt running into walls during ambulation especially as he became more fatigued with poor awareness or correction    Balance  Standardized Balance Assessment Standardized Balance Assessment: Dynamic Gait Index Dynamic Gait Index Level Surface: Mild Impairment Change in Gait Speed: Mild Impairment Gait with Horizontal Head Turns: Mild Impairment Gait with Vertical Head Turns: Mild Impairment Gait and Pivot  Turn: Moderate Impairment Step Over Obstacle: Mild Impairment Step Around Obstacles: Mild Impairment Steps: Moderate Impairment Total Score: 14   End of Session PT - End of Session Equipment Utilized During Treatment: Gait belt Activity Tolerance: Patient tolerated treatment well;Patient limited by fatigue Patient left: in bed;with call bell/phone within reach;Other (comment);with family/visitor present (sitter present)    WHITLOW,Yizel Canby HELEN 09/18/2011, 3:12 PM

## 2011-09-18 NOTE — Progress Notes (Signed)
I met with patient at bedside. His Mom was present as well as a Comptroller. Patient reportedly by sitter is ambulating to bathroom with IV pole and no physical assistance with IV pole or patient needed by sitter. Mom reports patient mentally seems close to baseline. Patient  Does not need inpt rehab at this time. Recommend therapy determination if HH or OP follow up needed. I have alerted RN CM and SW. [phone] with questions.
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
## 2011-09-18 NOTE — Progress Notes (Signed)
Pt's BP is 173/89, morning dose of metoprolol given.  Dr. Arthor Captain notified, no orders given.  Will continue to monitor pt.
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
## 2011-09-19 DIAGNOSIS — J690 Pneumonitis due to inhalation of food and vomit: Secondary | ICD-10-CM

## 2011-09-19 DIAGNOSIS — F19921 Other psychoactive substance use, unspecified with intoxication with delirium: Secondary | ICD-10-CM

## 2011-09-19 DIAGNOSIS — R197 Diarrhea, unspecified: Secondary | ICD-10-CM

## 2011-09-19 DIAGNOSIS — N179 Acute kidney failure, unspecified: Secondary | ICD-10-CM

## 2011-09-19 LAB — BASIC METABOLIC PANEL
BUN: 39 mg/dL — ABNORMAL HIGH (ref 6–23)
CO2: 17 mEq/L — ABNORMAL LOW (ref 19–32)
Calcium: 8.8 mg/dL (ref 8.4–10.5)
Creatinine, Ser: 2.52 mg/dL — ABNORMAL HIGH (ref 0.50–1.35)
GFR calc non Af Amer: 30 mL/min — ABNORMAL LOW (ref >90)
Glucose, Bld: 101 mg/dL — ABNORMAL HIGH (ref 70–99)
Sodium: 141 mEq/L (ref 135–145)

## 2011-09-19 LAB — CBC
HCT: 23.1 % — ABNORMAL LOW (ref 39.0–52.0)
Hemoglobin: 7.7 g/dL — ABNORMAL LOW (ref 13.0–17.0)
MCH: 28.1 pg (ref 26.0–34.0)
MCHC: 33.3 g/dL (ref 30.0–36.0)
MCV: 84.3 fL (ref 78.0–100.0)

## 2011-09-19 MED ORDER — METOPROLOL TARTRATE 25 MG PO TABS: 25.0000 mg | ORAL_TABLET | Freq: Two times a day (BID) | ORAL | Status: DC

## 2011-09-19 NOTE — Progress Notes (Signed)
Pt. discharge to floor,verbalized understanding of discharged instruction,medication,restriction,diet and follow up appointment.Baseline Vitals sign stable,Pt comfortable,no sign and symptom of distress. 

## 2011-09-19 NOTE — Progress Notes (Signed)
Clinical Social Work-CSW met with pt and mother at bedside to provide Substance abuse resources. Pt expressed understanding of inpatient vs. outpatient as well as program supported by employer. CSW also discussed supportive programs such as [facility] with family members.  Pt and family comfortable seeking out follow up post d/c No further needs- Jodean Lima, [phone]

## 2011-09-19 NOTE — Consult Note (Signed)
Clinical Social Work Department BRIEF PSYCHOSOCIAL ASSESSMENT 09/19/2011  Patient:  Alan Koch, Alan Koch     Account Number:  [id number]     Admit date:  09/06/2011  Clinical Social Worker:  Jacelyn Grip  Date/Time:  09/18/2011 03:00 PM  Referred by:  Physician  Date Referred:  09/18/2011 Referred for  Other - See comment  Substance Abuse   Other Referral:   other psychosocial needs   Interview type:  Patient Other interview type:    PSYCHOSOCIAL DATA Living Status:  WIFE Admitted from facility:   Level of care:   Primary support name:  Tonya Domangue/spouse/[phone] Primary support relationship to patient:  SPOUSE Degree of support available:   strong    CURRENT CONCERNS Current Concerns  Other - See comment  Substance Abuse   Other Concerns:    SOCIAL WORK ASSESSMENT / PLAN Pt transferred to [address] the afternoon of 5/14. Pt is being followed by psych and per notes appears to be mostly for medication management.    CSW met with pt at bedside to discuss pt other psychosocial needs and pt current substance abuse. Pt mother and pt sitter present at this time. Pt discussed that he feels much improved since yesterday. CSW discussed that initially yesterday the recommendation was for Inpatient rehab, but given pt improvements PT now recommending Outpatient Neuro Rehab with 24 hour supervision. CSW discussed with pt current substance use and options for outpatient and inpatient treatment. Pt stated that he would be agreeable to treatment. Pt discussed that his employer refers to an inpatient rehab in [place], [place]. CSW discussed other options for inpatient and outpatient substance abuse treatment options and pt agreeable to resources. Pt would like to discuss with pt wife and decide after discharge which facility he would like to seek. Pt provided permission for CSW to contact pt wife. CSW discussed with pt wife and stated that CSW will compile resources to provide to pt and pt  wife to follow up with once pt is discharged. Pt wife expressed understanding. CSW to compile resources for pt and provide pt and pt wife substance abuse treatment resources.   Assessment/plan status:  Information/Referral to [facility] Other assessment/ plan:   Information/referral to community resources:   Will follow up to provide Substance Abuse Treatment options for Outpatient and Inpatient Treatment.    PATIENT'S/FAMILY'S RESPONSE TO PLAN OF CARE: Pt alert and oriented. Pt quiet during interview but answered this CSW questions. Pt agreeable to resources and states that he plans to discuss with spouse and follow up after discharge.    Ashley Jacobs, MSW LCSW [phone]

## 2011-09-19 NOTE — Progress Notes (Signed)
Speech Language Pathology Dysphagia Treatment Patient Details Name: Alan Koch MRN: [id number] DOB: Jul 18, 1970 Today's Date: 09/19/2011 Time: 1478-2956 SLP Time Calculation (min): 15 min  Assessment / Plan / Recommendation Clinical Impression  Pt. seen today for dysphagia and cognitive treatment.  Per pt. and mother he is much more alert and aware.  Pt. required mod independence during observation with cracker and thin water via straw. No indicaitons of decreased airway protection.  Recommend continue regular texture diet and thin liquids.  Pt. stated he feels his cognition is not at baseline and is having problems intergrating his thoughts, decreased processing.  Recommend outpatient ST for executive functioning ability for home and eventual work environment.  SLP educated pt. and mom about outpatient ST services.     Diet Recommendation  Continue with Current Diet: Regular;Thin liquid    SLP Plan All goals met   Pertinent Vitals/Pain    Swallowing Goals  SLP Swallowing Goals Patient will consume recommended diet without observed clinical signs of aspiration with: Modified independent assistance Swallow Study Goal #1 - Progress: Met Swallow Study Goal #2 - Progress: Met  General Respiratory Status: Room air Behavior/Cognition: Alert;Cooperative;Pleasant mood Oral Cavity - Dentition: Adequate natural dentition Patient Positioning: Upright in bed  Oral Cavity - Oral Hygiene Does patient have any of the following "at risk" factors?: None of the above Brush patient's teeth BID with toothbrush (using toothpaste with fluoride): Yes   Dysphagia Treatment Treatment focused on: Skilled observation of diet tolerance;Patient/family/caregiver education Treatment Methods/Modalities: Skilled observation Type of PO's observed: Regular;Thin liquids Feeding: Able to feed self Liquids provided via: Straw Type of cueing: Verbal Amount of cueing: Minimal   Alan Koch M.Ed  Alan Koch [phone]  09/19/2011

## 2011-09-19 NOTE — Progress Notes (Signed)
Physical Therapy Treatment Patient Details Name: Alan Koch MRN: [id number] DOB: April 13, 1971 Today's Date: 09/19/2011 Time: 4098-1191 PT Time Calculation (min): 19 min  PT Assessment / Plan / Recommendation Comments on Treatment Session  Making quick progress, still recommend Outpatient neuro physical therapy for balance. Pt still with safety awareness deficits and continues to need full supervision for all out of bed activity.     Follow Up Recommendations  Outpatient PT (neuro for balance) ; 24 hour supervision   Barriers to Discharge        Equipment Recommendations  Defer to next venue    Recommendations for Other Services    Frequency     Plan Discharge plan remains appropriate    Precautions / Restrictions Precautions Precautions: Fall Precaution Comments: impulsive       Mobility  Transfers Sit to Stand: 5: Supervision;Without upper extremity assist;From chair/3-in-1 Stand to Sit: 5: Supervision;Without upper extremity assist;To chair/3-in-1 Details for Transfer Assistance: cues for reduced use of upper extremity for strengthening of lower extremities; sit<->stand x4 (with one seated rest break in the middle of session)  Ambulation/Gait Ambulation/Gait Assistance: 4: Min guard Ambulation Distance (Feet): 500 Feet Assistive device: None Ambulation/Gait Assistance Details: still staggered but no significant LOB today, did run into the wall on the right again however but more aware of it today; more fluidity to gait today but still appearing slightly hypotonic in nature Stairs Assistance: 4: Min guard;4: Min assist Stairs Assistance Details (indicate cue type and reason): 3 steps x3; slow pattern; close gaurding for safety and sequencing Stair Management Technique: Forwards;One rail Left    Exercises General Exercises - Lower Extremity Toe Raises: AROM;Both;10 reps;Standing (mingaurd, no UE support) Heel Raises: AROM;Both;10 reps;Standing (mingaurdA, no upper  extremity support) Other Exercises Other Exercises: step up on 7 inch step with bilateral LE x5 each    PT Goals Acute Rehab PT Goals PT Goal: Sit to Stand - Progress: Progressing toward goal PT Goal: Stand to Sit - Progress: Progressing toward goal PT Goal: Stand - Progress: Progressing toward goal PT Goal: Ambulate - Progress: Progressing toward goal PT Goal: Up/Down Stairs - Progress: Progressing toward goal  Visit Information  Last PT Received On: 09/19/11 Assistance Needed: +1    Subjective Data  Subjective: Im ready to go home.    Cognition  Overall Cognitive Status: Impaired Area of Impairment: Executive functioning;Awareness of deficits;Safety/judgement Arousal/Alertness: Awake/alert Orientation Level: Appears intact for tasks assessed Behavior During Session: Flat affect Current Attention Level: Selective Following Commands: Follows one step commands consistently;Follows multi-step commands consistently Safety/Judgement: Impulsive;Decreased awareness of need for assistance;Decreased safety judgement for tasks assessed Cognition - Other Comments: slower processing; getting up without assist although told specifically not to, did not acknowledge the chair alarm that was going off when he stood up and tried to find me in the hallway    Balance  High Level Balance High Level Balance Activites: Side stepping;Braiding;Direction changes;Turns;Sudden stops;Head turns High Level Balance Comments: pt with difficulty turning head secondary to a prior neck surgery, no major LOB during any of these activities but pt needing increased time to complete; minA for stability during braiding (pt performed bilaterally and very slowly for increased safety)   End of Session PT - End of Session Equipment Utilized During Treatment: Gait belt Activity Tolerance: Patient tolerated treatment well (easily fatigued but motivated and recovers quickly) Patient left: in chair;with call bell/phone  within reach;with chair alarm set    [facility] HELEN 09/19/2011, 8:58 AM
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
## 2011-09-19 NOTE — Progress Notes (Deleted)
Pt. discharge to floor,verbalized understanding of discharged instruction,medication,restriction,diet and follow up appointment.Baseline Vitals sign stable,Pt comfortable,no sign and symptom of distress. 

## 2011-09-19 NOTE — Progress Notes (Signed)
Nutrition Follow-up  Diet Order:  Regular PO intake 60-100%  Pt is now more alert and orientated and able to tolerate PO intake. Pt is likely meeting nutrition needs with PO intake. Weight loss is desirable for this pt as he was obese at admission. Weight loss likely r/t permissive underfeeding while intubated and decreased alertness for several days after extubation.   Meds: Scheduled Meds:   . antiseptic oral rinse  15 mL Mouth Rinse BID  . heparin subcutaneous  5,000 Units Subcutaneous Q8H  . metoprolol tartrate  25 mg Oral BID  . OLANZapine  5 mg Oral QHS  . pantoprazole  40 mg Oral Q1200  . potassium chloride  60 mEq Oral Q6H   Continuous Infusions:   . dextrose 75 mL/hr at 09/19/11 0749   PRN Meds:.acetaminophen  Labs:  CMP     Component Value Date/Time   NA 141 09/19/2011 0552   K 3.5 09/19/2011 0552   CL 111 09/19/2011 0552   CO2 17* 09/19/2011 0552   GLUCOSE 101* 09/19/2011 0552   BUN 39* 09/19/2011 0552   CREATININE 2.52* 09/19/2011 0552   CALCIUM 8.8 09/19/2011 0552   PROT 6.0 09/11/2011 0630   ALBUMIN 2.0* 09/11/2011 0630   AST 25 09/12/2011 0450   ALT 27 09/12/2011 0450   ALKPHOS 297* 09/11/2011 0630   BILITOT 0.9 09/11/2011 0630   GFRNONAA 30* 09/19/2011 0552   GFRAA 35* 09/19/2011 0552     Intake/Output Summary (Last 24 hours) at 09/19/11 1117 Last data filed at 09/19/11 1610  Gross per 24 hour  Intake   2380 ml  Output      1 ml  Net   2379 ml    Weight Status:  187 lbs, continue to trend down from 250 lbs admission weight.  Body mass index is 25.47 kg/(m^2). Overweight.   Re-estimated needs:  2100-2300 kcal, 100-110 gm protein  Nutrition Dx:  Pt no longer with inadequate oral intake.  No new nutrition dx at this time  Goal:  Intake to meet 90-100% of estimated nutrition needs, likely meet  Intervention:  No new nutrition interventions at this time.   Monitor:  PO intake, weight, labs, I/O's   Rudean Haskell Pager #:  [phone]

## 2011-09-22 LAB — CULTURE, BLOOD (ROUTINE X 2): Culture: NO GROWTH

## 2011-09-23 ENCOUNTER — Other Ambulatory Visit (HOSPITAL_COMMUNITY): Payer: Self-pay | Admitting: Psychiatry

## 2011-09-23 ENCOUNTER — Ambulatory Visit (HOSPITAL_COMMUNITY)
Admission: RE | Admit: 2011-09-23 | Discharge: 2011-09-23 | Disposition: A | Discharge status: Home / Self Care | Payer: BC Managed Care – PPO | Source: Ambulatory Visit | Attending: Emergency Medicine | Admitting: Emergency Medicine

## 2011-09-23 ENCOUNTER — Ambulatory Visit (HOSPITAL_COMMUNITY)
Admission: RE | Admit: 2011-09-23 | Discharge: 2011-09-23 | Disposition: A | Discharge status: Home / Self Care | Payer: BC Managed Care – PPO | Source: Ambulatory Visit | Attending: *Deleted | Admitting: *Deleted

## 2011-09-23 DIAGNOSIS — Z5189 Encounter for other specified aftercare: Secondary | ICD-10-CM | POA: Insufficient documentation

## 2011-09-23 DIAGNOSIS — M546 Pain in thoracic spine: Secondary | ICD-10-CM | POA: Insufficient documentation

## 2011-09-23 DIAGNOSIS — M7918 Myalgia, other site: Secondary | ICD-10-CM | POA: Insufficient documentation

## 2011-09-23 DIAGNOSIS — M6281 Muscle weakness (generalized): Secondary | ICD-10-CM | POA: Insufficient documentation

## 2011-09-23 DIAGNOSIS — G3184 Mild cognitive impairment, so stated: Secondary | ICD-10-CM | POA: Insufficient documentation

## 2011-09-23 DIAGNOSIS — R079 Chest pain, unspecified: Secondary | ICD-10-CM | POA: Insufficient documentation

## 2011-09-23 LAB — CULTURE, BLOOD (ROUTINE X 2)
Culture  Setup Time: 201305141724
Culture: NO GROWTH
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
## 2011-09-26 ENCOUNTER — Ambulatory Visit (HOSPITAL_COMMUNITY)
Admission: RE | Admit: 2011-09-26 | Discharge: 2011-09-26 | Disposition: A | Discharge status: Home / Self Care | Payer: BC Managed Care – PPO | Source: Ambulatory Visit | Attending: Family Medicine | Admitting: Family Medicine
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
## 2011-10-01 ENCOUNTER — Ambulatory Visit (HOSPITAL_COMMUNITY)
Admission: RE | Admit: 2011-10-01 | Discharge: 2011-10-01 | Disposition: A | Discharge status: Home / Self Care | Payer: BC Managed Care – PPO | Source: Ambulatory Visit | Attending: Family Medicine | Admitting: Family Medicine
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
## 2011-10-03 ENCOUNTER — Ambulatory Visit (HOSPITAL_COMMUNITY)
Admission: RE | Admit: 2011-10-03 | Discharge: 2011-10-03 | Disposition: A | Discharge status: Home / Self Care | Payer: BC Managed Care – PPO | Source: Ambulatory Visit | Attending: Family Medicine | Admitting: Family Medicine
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
## 2011-10-08 ENCOUNTER — Ambulatory Visit (HOSPITAL_COMMUNITY): Payer: BC Managed Care – PPO | Admitting: Speech Pathology

## 2011-10-08 ENCOUNTER — Ambulatory Visit (HOSPITAL_COMMUNITY)
Admission: RE | Admit: 2011-10-08 | Discharge: 2011-10-08 | Disposition: A | Discharge status: Home / Self Care | Payer: BC Managed Care – PPO | Source: Ambulatory Visit | Attending: Emergency Medicine | Admitting: Emergency Medicine

## 2011-10-08 DIAGNOSIS — R079 Chest pain, unspecified: Secondary | ICD-10-CM | POA: Insufficient documentation

## 2011-10-08 DIAGNOSIS — M6281 Muscle weakness (generalized): Secondary | ICD-10-CM | POA: Insufficient documentation

## 2011-10-08 DIAGNOSIS — Z5189 Encounter for other specified aftercare: Secondary | ICD-10-CM | POA: Insufficient documentation

## 2011-10-08 DIAGNOSIS — M546 Pain in thoracic spine: Secondary | ICD-10-CM | POA: Insufficient documentation

## 2011-10-08 DIAGNOSIS — G3184 Mild cognitive impairment, so stated: Secondary | ICD-10-CM | POA: Insufficient documentation
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
## 2011-10-10 ENCOUNTER — Ambulatory Visit (HOSPITAL_COMMUNITY): Payer: BC Managed Care – PPO | Admitting: Physical Therapy

## 2011-10-16 ENCOUNTER — Telehealth (HOSPITAL_COMMUNITY): Payer: Self-pay

## 2011-10-16 ENCOUNTER — Ambulatory Visit (HOSPITAL_COMMUNITY): Payer: BC Managed Care – PPO

## 2012-08-07 LAB — DRUG SCREEN, URINE
Amphetamines, Ur Screen: NEGATIVE (ref ?–1000)
Barbiturates, Ur Screen: NEGATIVE (ref ?–200)
Cocaine Metabolite,Ur ~~LOC~~: NEGATIVE (ref ?–300)
MDMA (Ecstasy)Ur Screen: NEGATIVE (ref ?–500)
Opiate, Ur Screen: POSITIVE (ref ?–300)
Phencyclidine (PCP) Ur S: NEGATIVE (ref ?–25)

## 2012-08-07 LAB — COMPREHENSIVE METABOLIC PANEL
Alkaline Phosphatase: 211 U/L — ABNORMAL HIGH (ref 50–136)
BUN: 6 mg/dL — ABNORMAL LOW (ref 7–18)
Calcium, Total: 8.9 mg/dL (ref 8.5–10.1)
Co2: 30 mmol/L (ref 21–32)
Creatinine: 0.88 mg/dL (ref 0.60–1.30)
EGFR (African American): >60
Glucose: 97 mg/dL (ref 65–99)
Osmolality: 271 (ref 275–301)
Potassium: 4 mmol/L (ref 3.5–5.1)
SGOT(AST): 31 U/L (ref 15–37)
Sodium: 137 mmol/L (ref 136–145)
Total Protein: 7.3 g/dL (ref 6.4–8.2)

## 2012-08-07 LAB — URINALYSIS, COMPLETE
Ketone: NEGATIVE
Leukocyte Esterase: NEGATIVE
Nitrite: NEGATIVE
Protein: NEGATIVE
Squamous Epithelial: <1
WBC UR: <1 /HPF (ref 0–5)

## 2012-08-07 LAB — ETHANOL: Ethanol %: <0.003 % (ref 0.000–0.080)

## 2012-08-07 LAB — CBC
HCT: 35.2 % — ABNORMAL LOW (ref 40.0–52.0)
MCHC: 33.2 g/dL (ref 32.0–36.0)
MCV: 89 fL (ref 80–100)
Platelet: 373 10*3/uL (ref 150–440)

## 2012-08-07 LAB — TSH: Thyroid Stimulating Horm: 1.56 u[IU]/mL

## 2012-08-08 ENCOUNTER — Inpatient Hospital Stay: Payer: Self-pay | Admitting: Psychiatry

## 2012-08-09 LAB — CBC WITH DIFFERENTIAL/PLATELET
Basophil #: 0 10*3/uL (ref 0.0–0.1)
Basophil %: 0.2 %
Eosinophil #: 0.1 10*3/uL (ref 0.0–0.7)
Eosinophil %: 0.7 %
HCT: 31.5 % — ABNORMAL LOW (ref 40.0–52.0)
HGB: 10.6 g/dL — ABNORMAL LOW (ref 13.0–18.0)
Lymphocyte #: 2.8 10*3/uL (ref 1.0–3.6)
MCH: 29.9 pg (ref 26.0–34.0)
MCHC: 33.6 g/dL (ref 32.0–36.0)
MCV: 89 fL (ref 80–100)
Monocyte %: 5.8 %
Neutrophil %: 75.2 %
Platelet: 410 10*3/uL (ref 150–440)
RBC: 3.55 10*6/uL — ABNORMAL LOW (ref 4.40–5.90)
RDW: 14.3 % (ref 11.5–14.5)
WBC: 15.4 10*3/uL — ABNORMAL HIGH (ref 3.8–10.6)

## 2012-08-10 LAB — CBC WITH DIFFERENTIAL/PLATELET
Basophil #: 0 10*3/uL (ref 0.0–0.1)
Basophil %: 0.3 %
Eosinophil %: 1.1 %
HGB: 12 g/dL — ABNORMAL LOW (ref 13.0–18.0)
MCHC: 33.6 g/dL (ref 32.0–36.0)
Monocyte #: 0.6 x10 3/mm (ref 0.2–1.0)
Monocyte %: 5 %
Neutrophil %: 70.2 %
Platelet: 527 10*3/uL — ABNORMAL HIGH (ref 150–440)
RDW: 14.5 % (ref 11.5–14.5)

## 2014-05-09 IMAGING — CR DG CHEST 1V PORT
1 series · 1 of 1 positions shown · non-contrast
Comparison: Yesterday

CLINICAL DATA: Assess pleural effusion

PORTABLE CHEST - 1 VIEW

[AP]
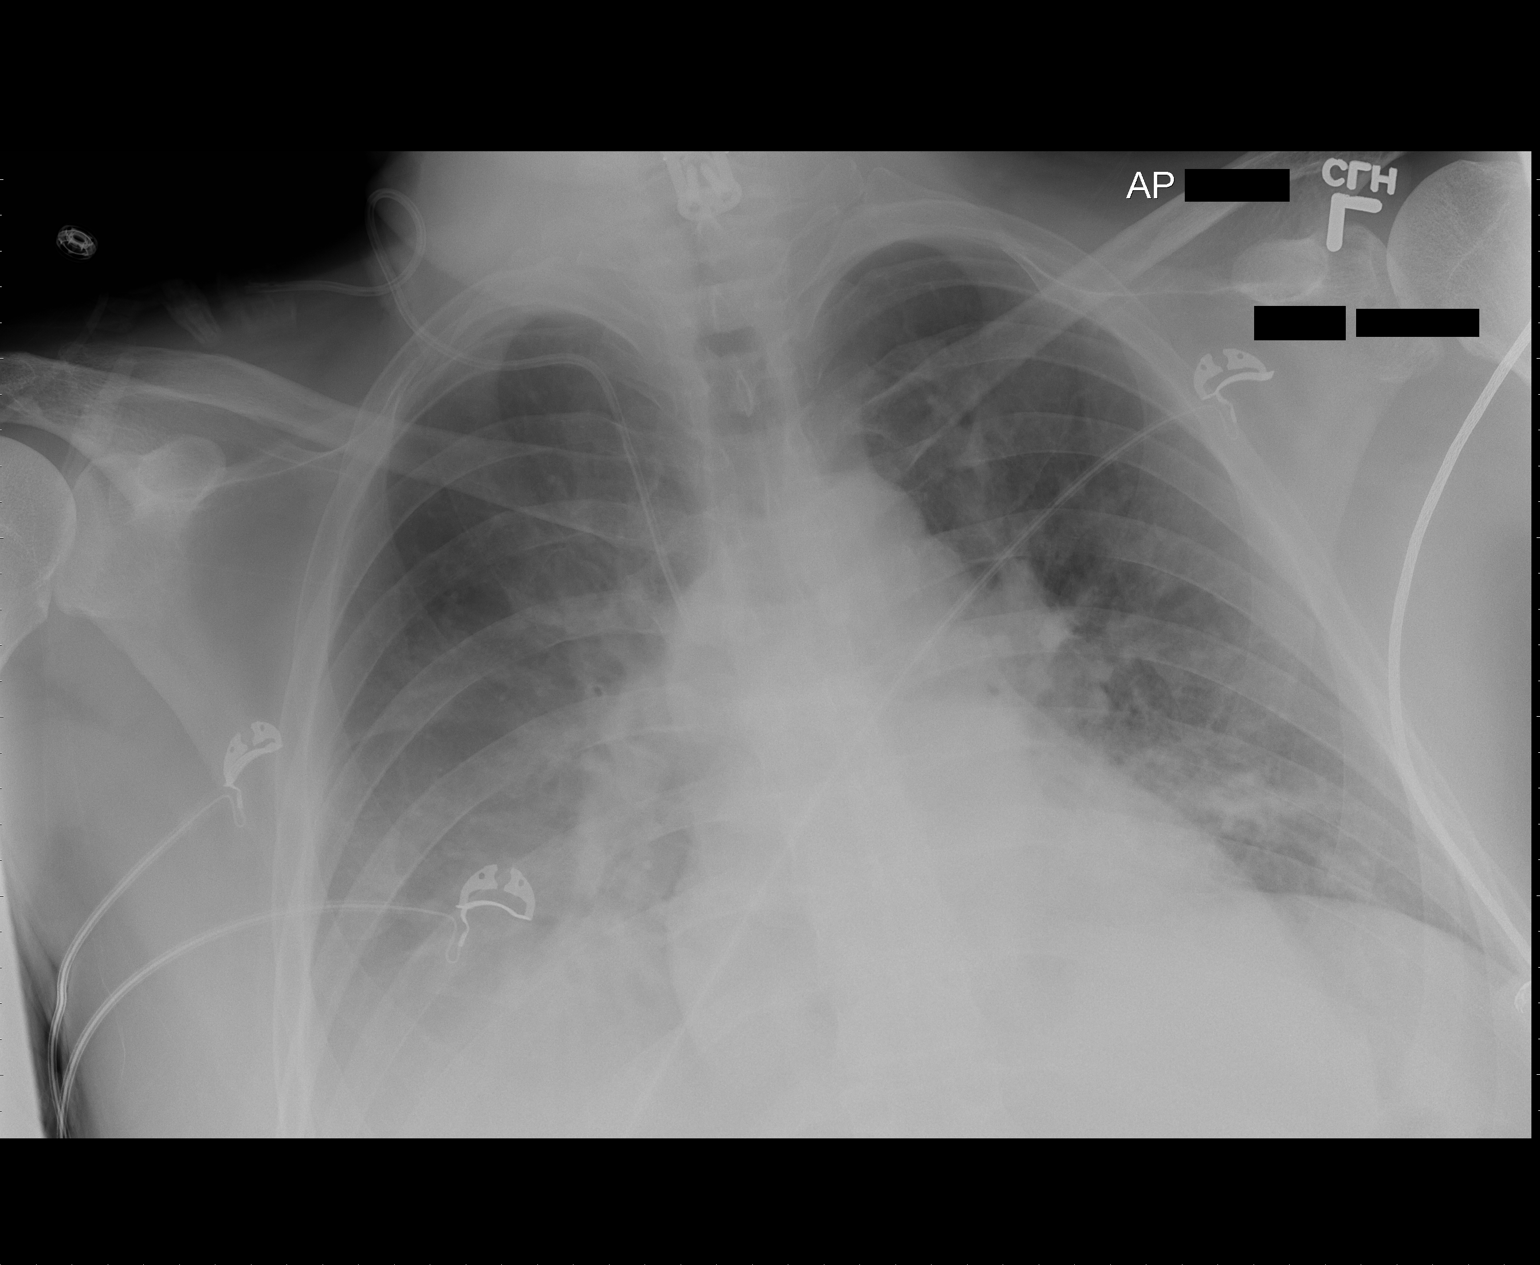

[1 of 1 positions shown; findings below may reference images not displayed]

FINDINGS: Normal heart size.  Stable right internal jugular vein
center venous catheter.  Diffuse edema unchanged.  Right pleural
effusions stable.  Endotracheal and NG tubes removed.  No
pneumothorax
IMPRESSION: Extubated.  Stable airspace disease and right effusion.

## 2014-07-19 ENCOUNTER — Emergency Department (HOSPITAL_COMMUNITY)
Admission: EM | Admit: 2014-07-19 | Discharge: 2014-07-19 | Disposition: A | Discharge status: Home / Self Care | Payer: Managed Care, Other (non HMO) | Attending: Emergency Medicine | Admitting: Emergency Medicine

## 2014-07-19 ENCOUNTER — Encounter (HOSPITAL_COMMUNITY): Payer: Self-pay | Admitting: Emergency Medicine

## 2014-07-19 DIAGNOSIS — Z79899 Other long term (current) drug therapy: Secondary | ICD-10-CM | POA: Insufficient documentation

## 2014-07-19 DIAGNOSIS — Z8701 Personal history of pneumonia (recurrent): Secondary | ICD-10-CM | POA: Insufficient documentation

## 2014-07-19 DIAGNOSIS — I10 Essential (primary) hypertension: Secondary | ICD-10-CM | POA: Insufficient documentation

## 2014-07-19 DIAGNOSIS — R51 Headache: Secondary | ICD-10-CM | POA: Diagnosis not present

## 2014-07-19 DIAGNOSIS — Z8709 Personal history of other diseases of the respiratory system: Secondary | ICD-10-CM | POA: Diagnosis not present

## 2014-07-19 DIAGNOSIS — Z791 Long term (current) use of non-steroidal anti-inflammatories (NSAID): Secondary | ICD-10-CM | POA: Diagnosis not present

## 2014-07-19 DIAGNOSIS — Z87891 Personal history of nicotine dependence: Secondary | ICD-10-CM | POA: Diagnosis not present

## 2014-07-19 DIAGNOSIS — Z8739 Personal history of other diseases of the musculoskeletal system and connective tissue: Secondary | ICD-10-CM | POA: Diagnosis not present

## 2014-07-19 DIAGNOSIS — Z87448 Personal history of other diseases of urinary system: Secondary | ICD-10-CM | POA: Insufficient documentation

## 2014-07-19 LAB — CBC WITH DIFFERENTIAL/PLATELET
Basophils Absolute: 0 10*3/uL (ref 0.0–0.1)
Basophils Relative: 0 % (ref 0–1)
Eosinophils Absolute: 0.2 10*3/uL (ref 0.0–0.7)
Eosinophils Relative: 2 % (ref 0–5)
HCT: 38.5 % — ABNORMAL LOW (ref 39.0–52.0)
HEMOGLOBIN: 13.2 g/dL (ref 13.0–17.0)
LYMPHS ABS: 2.8 10*3/uL (ref 0.7–4.0)
Lymphocytes Relative: 21 % (ref 12–46)
MCH: 31.9 pg (ref 26.0–34.0)
MCHC: 34.3 g/dL (ref 30.0–36.0)
MCV: 93 fL (ref 78.0–100.0)
MONOS PCT: 5 % (ref 3–12)
Monocytes Absolute: 0.7 10*3/uL (ref 0.1–1.0)
NEUTROS PCT: 72 % (ref 43–77)
Neutro Abs: 9.5 10*3/uL — ABNORMAL HIGH (ref 1.7–7.7)
Platelets: 317 10*3/uL (ref 150–400)
RBC: 4.14 MIL/uL — ABNORMAL LOW (ref 4.22–5.81)
RDW: 12.9 % (ref 11.5–15.5)
WBC: 13.2 10*3/uL — ABNORMAL HIGH (ref 4.0–10.5)

## 2014-07-19 LAB — BASIC METABOLIC PANEL
Anion gap: 6 (ref 5–15)
BUN: 9 mg/dL (ref 6–23)
CHLORIDE: 105 mmol/L (ref 96–112)
CO2: 28 mmol/L (ref 19–32)
Calcium: 9 mg/dL (ref 8.4–10.5)
Creatinine, Ser: 0.84 mg/dL (ref 0.50–1.35)
GFR calc Af Amer: >90 mL/min (ref >90)
Glucose, Bld: 95 mg/dL (ref 70–99)
POTASSIUM: 3.9 mmol/L (ref 3.5–5.1)
Sodium: 139 mmol/L (ref 135–145)

## 2014-07-19 MED ORDER — METOPROLOL TARTRATE 25 MG PO TABS
25.0000 mg | ORAL_TABLET | Freq: Once | ORAL | Status: AC
  Administered 2014-07-19: 25 mg via ORAL
  Filled 2014-07-19: qty 1

## 2014-07-19 MED ORDER — METOPROLOL TARTRATE 100 MG PO TABS: 100.0000 mg | ORAL_TABLET | Freq: Two times a day (BID) | ORAL | Status: AC

## 2014-07-19 NOTE — ED Notes (Signed)
MD at bedside. 
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
## 2014-07-19 NOTE — ED Notes (Signed)
Had high blood pressure at work yesterday.  Pt do not remember readings.

## 2014-07-19 NOTE — Discharge Instructions (Signed)
Start taking metoprolol 25 mg twice daily.  Keep a record of your blood pressures which you can take to your next doctor's appointment.   Hypertension Hypertension, commonly called high blood pressure, is when the force of blood pumping through your arteries is too strong. Your arteries are the blood vessels that carry blood from your heart throughout your body. A blood pressure reading consists of a higher number over a lower number, such as 110/72. The higher number (systolic) is the pressure inside your arteries when your heart pumps. The lower number (diastolic) is the pressure inside your arteries when your heart relaxes. Ideally you want your blood pressure below 120/80. Hypertension forces your heart to work harder to pump blood. Your arteries may become narrow or stiff. Having hypertension puts you at risk for heart disease, stroke, and other problems.  RISK FACTORS Some risk factors for high blood pressure are controllable. Others are not.  Risk factors you cannot control include:   Race. You may be at higher risk if you are African American.  Age. Risk increases with age.  Gender. Men are at higher risk than women before age 44 years. After age 44, women are at higher risk than men. Risk factors you can control include:  Not getting enough exercise or physical activity.  Being overweight.  Getting too much fat, sugar, calories, or salt in your diet.  Drinking too much alcohol. SIGNS AND SYMPTOMS Hypertension does not usually cause signs or symptoms. Extremely high blood pressure (hypertensive crisis) may cause headache, anxiety, shortness of breath, and nosebleed. DIAGNOSIS  To check if you have hypertension, your health care provider will measure your blood pressure while you are seated, with your arm held at the level of your heart. It should be measured at least twice using the same arm. Certain conditions can cause a difference in blood pressure between your right and left  arms. A blood pressure reading that is higher than normal on one occasion does not mean that you need treatment. If one blood pressure reading is high, ask your health care provider about having it checked again. TREATMENT  Treating high blood pressure includes making lifestyle changes and possibly taking medicine. Living a healthy lifestyle can help lower high blood pressure. You may need to change some of your habits. Lifestyle changes may include:  Following the DASH diet. This diet is high in fruits, vegetables, and whole grains. It is low in salt, red meat, and added sugars.  Getting at least 2 hours of brisk physical activity every week.  Losing weight if necessary.  Not smoking.  Limiting alcoholic beverages.  Learning ways to reduce stress. If lifestyle changes are not enough to get your blood pressure under control, your health care provider may prescribe medicine. You may need to take more than one. Work closely with your health care provider to understand the risks and benefits. HOME CARE INSTRUCTIONS  Have your blood pressure rechecked as directed by your health care provider.   Take medicines only as directed by your health care provider. Follow the directions carefully. Blood pressure medicines must be taken as prescribed. The medicine does not work as well when you skip doses. Skipping doses also puts you at risk for problems.   Do not smoke.   Monitor your blood pressure at home as directed by your health care provider. SEEK MEDICAL CARE IF:   You think you are having a reaction to medicines taken.  You have recurrent headaches or feel dizzy.  You have swelling in your ankles.  You have trouble with your vision. SEEK IMMEDIATE MEDICAL CARE IF:  You develop a severe headache or confusion.  You have unusual weakness, numbness, or feel faint.  You have severe chest or abdominal pain.  You vomit repeatedly.  You have trouble breathing. MAKE SURE YOU:     Understand these instructions.  Will watch your condition.  Will get help right away if you are not doing well or get worse. Document Released: 04/22/2005 Document Revised: 09/06/2013 Document Reviewed: 02/12/2013 [facility] Patient Information 2015 [place], [place]. This information is not intended to replace advice given to you by your health care provider. Make sure you discuss any questions you have with your health care provider.

## 2014-08-26 NOTE — Consult Note (Signed)
Consult: Exploration of CD-IOP at this Inpatient Behavioral Medicine Discharge: Patient was seen as requested by Carola FrostLee Ann Roush, PsyD, HSP-P to [facility] this [facility]. He was able to detail events that led to the need of treatment at this inpatient level of care and was seen as a prime candidate for treatment at this level of care. He was able to express  disinterest in the CD-IOP post this inpatient Behavioral Medicine treatment. He expressed belief that he does not have a problem related to Rx abuse, Substance abuse and that he could possibly benefit from a one night a week meeting.  Plans are to not follow up in the CD-IOP at this inpatient discharge.  No appointment was made for follow-up [facility] post this inpatient treatment.   Electronic Signatures: Huel Cotehomas, Richard (PsyD)  (Signed on 07-Apr-14 13:06)  Authored  Last Updated: 07-Apr-14 13:06 by Huel Cotehomas, Richard (PsyD)
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
## 2023-09-05 ENCOUNTER — Other Ambulatory Visit (HOSPITAL_COMMUNITY): Payer: Self-pay | Admitting: Nurse Practitioner

## 2023-09-05 DIAGNOSIS — Z122 Encounter for screening for malignant neoplasm of respiratory organs: Secondary | ICD-10-CM

## 2023-09-15 ENCOUNTER — Encounter (INDEPENDENT_AMBULATORY_CARE_PROVIDER_SITE_OTHER): Payer: Self-pay | Admitting: *Deleted

## 2023-10-13 ENCOUNTER — Ambulatory Visit (INDEPENDENT_AMBULATORY_CARE_PROVIDER_SITE_OTHER): Admitting: Gastroenterology

## 2023-10-14 ENCOUNTER — Encounter (INDEPENDENT_AMBULATORY_CARE_PROVIDER_SITE_OTHER): Payer: Self-pay | Admitting: *Deleted

## 2023-11-10 ENCOUNTER — Ambulatory Visit (HOSPITAL_COMMUNITY): Admission: RE | Admit: 2023-11-10 | Source: Ambulatory Visit

## 2023-11-10 ENCOUNTER — Encounter (HOSPITAL_COMMUNITY): Payer: Self-pay

## 2024-02-17 DIAGNOSIS — J320 Chronic maxillary sinusitis: Secondary | ICD-10-CM | POA: Diagnosis not present

## 2024-03-19 DIAGNOSIS — I10 Essential (primary) hypertension: Secondary | ICD-10-CM | POA: Diagnosis not present

## 2024-03-26 DIAGNOSIS — I10 Essential (primary) hypertension: Secondary | ICD-10-CM | POA: Diagnosis not present

## 2024-03-26 DIAGNOSIS — F17210 Nicotine dependence, cigarettes, uncomplicated: Secondary | ICD-10-CM | POA: Diagnosis not present

## 2024-03-26 DIAGNOSIS — F1120 Opioid dependence, uncomplicated: Secondary | ICD-10-CM | POA: Diagnosis not present

## 2024-03-26 DIAGNOSIS — N401 Enlarged prostate with lower urinary tract symptoms: Secondary | ICD-10-CM | POA: Diagnosis not present

## 2024-03-26 DIAGNOSIS — R1084 Generalized abdominal pain: Secondary | ICD-10-CM | POA: Diagnosis not present

## 2024-03-26 DIAGNOSIS — E782 Mixed hyperlipidemia: Secondary | ICD-10-CM | POA: Diagnosis not present
